# Patient Record
Sex: Female | Born: 1970 | Race: White | Hispanic: No | Marital: Married | Smoking: Never smoker
Health system: Southern US, Community
[De-identification: ages and names within clinical notes are randomized; demographics above are authoritative.]

## PROBLEM LIST (undated history)

## (undated) DIAGNOSIS — N809 Endometriosis, unspecified: Secondary | ICD-10-CM

## (undated) DIAGNOSIS — N200 Calculus of kidney: Secondary | ICD-10-CM

## (undated) DIAGNOSIS — T7840XA Allergy, unspecified, initial encounter: Secondary | ICD-10-CM

## (undated) HISTORY — PX: OOPHORECTOMY: SHX86

---

## 2000-07-27 ENCOUNTER — Encounter: Admission: RE | Admit: 2000-07-27 | Discharge: 2000-07-27 | Payer: Self-pay | Admitting: Obstetrics and Gynecology

## 2000-07-27 ENCOUNTER — Encounter: Payer: Self-pay | Admitting: Obstetrics and Gynecology

## 2002-05-27 ENCOUNTER — Other Ambulatory Visit: Admission: RE | Admit: 2002-05-27 | Discharge: 2002-05-27 | Payer: Self-pay | Admitting: Obstetrics and Gynecology

## 2007-07-10 ENCOUNTER — Encounter (INDEPENDENT_AMBULATORY_CARE_PROVIDER_SITE_OTHER): Payer: Self-pay | Admitting: Obstetrics and Gynecology

## 2007-07-10 ENCOUNTER — Ambulatory Visit (HOSPITAL_COMMUNITY): Admission: RE | Admit: 2007-07-10 | Discharge: 2007-07-10 | Payer: Self-pay | Admitting: Obstetrics and Gynecology

## 2010-12-27 NOTE — Op Note (Signed)
Alisha Lam, Alisha Lam                 ACCOUNT NO.:  [id number]   MEDICAL RECORD NO.:  [id number]          PATIENT TYPE:  AMB   LOCATION:  SDC                           FACILITY:  [facility]   PHYSICIAN:  Guy Sandifer. Henderson Cloud, M.D. DATE OF BIRTH:  May 01, 1971   DATE OF PROCEDURE:  07/10/2007  DATE OF DISCHARGE:                               OPERATIVE REPORT   PREOPERATIVE DIAGNOSIS:  Bilateral ovarian cysts.   POSTOPERATIVE DIAGNOSIS:  Severe endometriosis.   PROCEDURE:  Laparoscopy with bilateral ovarian cystectomy, lysis of  adhesions and ablation of endometriosis.   SURGEON:  Harold Hedge, MD   ANESTHESIA:  General with endotracheal intubation.   SPECIMENS:  Bilateral ovarian cyst walls and bilateral ovarian cyst  fluid, all the pathology.   INDICATIONS AND CONSENT:  The patient is a 40 year old married white  female G1, P0, husband status post vasectomy who is essentially without  complaint.  She has bilateral ovarian cysts.  Details have been dictated  in the history and physical.  Possible bilateral ovarian cystectomy,  possible unilateral salpingo-oophorectomy has been discussed.  The  patient understands the risk of recurrent endometriomas.  However, she  desires to retain one or both ovaries if at all possible.  Potential  risks and complications have been discussed including but not limited to  infection, organ damage, bleeding requiring transfusion of blood  products with possible HIV and hepatitis acquisition, DVT, PE,  pneumonia, postoperative dyspareunia, pelvic pain.  All questions were  answered and consent signed on the chart.   FINDINGS:  Upper abdomen is grossly normal.  Appendix is normal.  Uterus  is normal in size and contour.  Anterior cul-de-sac is normal.  Posterior cul-de-sac contains multiple filmy adhesions that are  beginning to tent up the rectum to the lower uterine segment.  There are  dark black implants of endometriosis on the right pelvic sidewall.  The  right ovary contains a 4 to 5-cm smooth translucent cyst and this is  adherent to the right pelvic sidewall.  The right fallopian tube is  normal.  Left ovary contains a similarly sized cyst and was not  adherent.  The left fallopian tube is normal.   PROCEDURE:  The patient taken to operating room where she is identified,  placed in dorsosupine position and general anesthesia is induced via  endotracheal intubation.  She is then placed in dorsal lithotomy  position where she is prepped abdominally and vaginally, bladder is  straight catheterized and she is draped.  A Hulka tenaculum was placed  in the uterus as a manipulator.  She is draped in sterile fashion.  The  infraumbilical and suprapubic areas injected the midline with 0.5% plain  Marcaine.  A small infraumbilical incision is made and disposable Veress  needle was placed.  Normal syringe and drop test are noted.  2 liters of  gas are insufflated under low pressure with good tympany in the right  upper quadrant.  Veress needle is removed and a 10/11 XL bladeless  disposable trocar sleeve was placed using direct visualization with  diagnostic laparoscope.  After placement,  the diagnostic scope was  removed and the operative laparoscope was placed.  The above findings  were noted.  Small suprapubic incision is made and a 5-mm bladeless XL  disposable trocar sleeve was placed under direct visualization without  difficulty.  The above findings were noted.  The right ovarian cyst was  then aspirated for a thick chocolate fluid.  The cyst wall is then  sharply resected.  The remaining base of the cyst is cauterized with  bipolar cautery.  This also releases the ovary from the right pelvic  sidewall.  Good hemostasis is noted.  Copious irrigation is carried out  throughout the case.  Similar procedure is carried out on the left  ovary.  The adhesions in the posterior cul-de-sac to taken down sharply  without difficulty.  After inspection  of the location of the right  ureter, the implants of pelvic sidewall are cauterized with bipolar  cautery well clear of the course of the ureter.  After assuring  hemostasis and recovering all the excess fluid, Interceed is back loaded  through the laparoscopic and placed around each ovary.  Suprapubic  trocar sleeve is removed.  Pneumoperitoneum is reduced and the umbilical  trocar sleeve was removed.  The umbilical incision is closed on the skin  with interrupted 3-0 Vicryl sutures which is also used on the suprapubic  incision as well.  Dermabond was placed on both incisions.  Dressings  were applied.  Hulka tenaculum is removed and good hemostasis is noted.  All counts correct.  The patient is awakened and taken to recovery room  in condition.      Guy Sandifer Henderson Cloud, M.D.  Electronically Signed     JET/MEDQ  D:  07/10/2007  T:  07/10/2007  Job:  [id number]

## 2010-12-27 NOTE — H&P (Signed)
Alisha Lam, Alisha Lam                 ACCOUNT NO.:  [id number]   MEDICAL RECORD NO.:  [id number]          PATIENT TYPE:  AMB   LOCATION:                                FACILITY:  [facility]   PHYSICIAN:  Guy Sandifer. Henderson Cloud, M.D. DATE OF BIRTH:  07/21/1971   DATE OF ADMISSION:  07/10/2007  DATE OF DISCHARGE:                              HISTORY & PHYSICAL   CHIEF COMPLAINT:  Bilateral ovarian cysts.   HISTORY OF PRESENT ILLNESS:  The patient is 40 year old married white  female G1 P0, husband status post vasectomy who was without complaint.  At annual examination in October of this year, she had bilateral adnexal  masses.  Subsequent ultrasound on May 31, 2007 revealed a 4.4 cm  cyst on the right and 4.9 cm cyst on left ovaries.  Both were suspicious  for endometriomas.  A repeat ultrasound on July 01, 2007 revealed  these to be essentially unchanged.  Laparoscopy with bilateral ovarian  cystectomy has been discussed.  The patient understands the risk for  recurrence of endometrioma in the setting of severe endometriosis.  However, in view of her age and the bilaterality at the of the cysts,  she would prefer to keep one or both ovaries if possible.  Potential  risks and complications have also been reviewed.   PAST MEDICAL HISTORY:  Negative.   PAST SURGICAL HISTORY:  Bilateral breast augmentation.   OBSTETRICAL HISTORY:  TAB times one.   FAMILY HISTORY:  Chronic hypertension in maternal grandmother.   SOCIAL HISTORY:  Denies tobacco, alcohol or drug abuse.   REVIEW OF SYSTEMS:  NEURO:  Denies headache.  CARDIO:  No chest pain.  PULMONARY: Denies shortness of breath.  GI: Denies recent changes in  bowel habits.   MEDICATIONS:  None.   ALLERGIES:  NO KNOWN DRUG ALLERGIES.   PHYSICAL EXAMINATION:  VITAL SIGNS:  Height 5 feet 2 inches, weight 103  pounds, blood pressure 102/68. HEENT: Without thyromegaly.  LUNGS: Clear to auscultation.  HEART:  Regular rate and rhythm.  BACK:   No CVA tenderness.  BREASTS:  Status post augmentation bilaterally without mass, retraction,  discharge.  ABDOMEN: Soft, nontender without masses.  PELVIC:  Both vagina and cervix without lesion.  Uterus is retroverted,  normal size, mobile, nontender.  There are bilateral adnexal masses 3-5  cm in size.  EXTREMITIES: Grossly within normal limits.  NEUROLOGICAL:  Exam grossly within normal limits.   ASSESSMENT:  Bilateral ovarian cysts.   PLAN:  Laparoscopy, probable bilateral ovarian cystectomy, possible  salpingo-oophorectomy.      Guy Sandifer Henderson Cloud, M.D.  Electronically Signed     JET/MEDQ  D:  07/01/2007  T:  07/02/2007  Job:  [id number]

## 2011-05-23 LAB — CBC
HCT: 40.5
Hemoglobin: 14
MCHC: 34.5
MCV: 87.7
Platelets: 204
RBC: 4.62
RDW: 12.5
WBC: 6.4

## 2011-10-25 ENCOUNTER — Other Ambulatory Visit: Payer: Self-pay | Admitting: Obstetrics and Gynecology

## 2012-02-26 ENCOUNTER — Other Ambulatory Visit: Payer: Self-pay | Admitting: Obstetrics and Gynecology

## 2013-07-08 ENCOUNTER — Emergency Department (HOSPITAL_COMMUNITY)
Admission: EM | Admit: 2013-07-08 | Discharge: 2013-07-08 | Disposition: A | Discharge status: Home / Self Care | Payer: BC Managed Care – PPO | Attending: Emergency Medicine | Admitting: Emergency Medicine

## 2013-07-08 ENCOUNTER — Emergency Department (HOSPITAL_COMMUNITY): Payer: BC Managed Care – PPO

## 2013-07-08 ENCOUNTER — Encounter (HOSPITAL_COMMUNITY): Payer: Self-pay | Admitting: Emergency Medicine

## 2013-07-08 DIAGNOSIS — N23 Unspecified renal colic: Secondary | ICD-10-CM

## 2013-07-08 DIAGNOSIS — Z3202 Encounter for pregnancy test, result negative: Secondary | ICD-10-CM | POA: Insufficient documentation

## 2013-07-08 DIAGNOSIS — Z8742 Personal history of other diseases of the female genital tract: Secondary | ICD-10-CM | POA: Insufficient documentation

## 2013-07-08 DIAGNOSIS — N201 Calculus of ureter: Secondary | ICD-10-CM | POA: Insufficient documentation

## 2013-07-08 DIAGNOSIS — Z79899 Other long term (current) drug therapy: Secondary | ICD-10-CM | POA: Insufficient documentation

## 2013-07-08 HISTORY — DX: Endometriosis, unspecified: N80.9

## 2013-07-08 LAB — CBC WITH DIFFERENTIAL/PLATELET
Basophils Absolute: 0 10*3/uL (ref 0.0–0.1)
Basophils Relative: 0 % (ref 0–1)
Eosinophils Absolute: 0 10*3/uL (ref 0.0–0.7)
Eosinophils Relative: 0 % (ref 0–5)
HCT: 40.1 % (ref 36.0–46.0)
Hemoglobin: 13.9 g/dL (ref 12.0–15.0)
Lymphocytes Relative: 11 % — ABNORMAL LOW (ref 12–46)
Lymphs Abs: 1 10*3/uL (ref 0.7–4.0)
MCH: 30 pg (ref 26.0–34.0)
MCHC: 34.7 g/dL (ref 30.0–36.0)
MCV: 86.4 fL (ref 78.0–100.0)
Monocytes Absolute: 0.4 10*3/uL (ref 0.1–1.0)
Monocytes Relative: 4 % (ref 3–12)
Neutro Abs: 7.8 10*3/uL — ABNORMAL HIGH (ref 1.7–7.7)
Neutrophils Relative %: 84 % — ABNORMAL HIGH (ref 43–77)
Platelets: 236 10*3/uL (ref 150–400)
RBC: 4.64 MIL/uL (ref 3.87–5.11)
RDW: 12.3 % (ref 11.5–15.5)
WBC: 9.3 10*3/uL (ref 4.0–10.5)

## 2013-07-08 LAB — URINE MICROSCOPIC-ADD ON

## 2013-07-08 LAB — COMPREHENSIVE METABOLIC PANEL
ALT: 15 U/L (ref 0–35)
AST: 19 U/L (ref 0–37)
Albumin: 4 g/dL (ref 3.5–5.2)
Alkaline Phosphatase: 76 U/L (ref 39–117)
BUN: 12 mg/dL (ref 6–23)
CO2: 25 mEq/L (ref 19–32)
Calcium: 9.3 mg/dL (ref 8.4–10.5)
Chloride: 103 mEq/L (ref 96–112)
Creatinine, Ser: 1.05 mg/dL (ref 0.50–1.10)
GFR calc Af Amer: 74 mL/min — ABNORMAL LOW (ref >90)
GFR calc non Af Amer: 64 mL/min — ABNORMAL LOW (ref >90)
Glucose, Bld: 121 mg/dL — ABNORMAL HIGH (ref 70–99)
Potassium: 3.9 mEq/L (ref 3.5–5.1)
Sodium: 139 mEq/L (ref 135–145)
Total Bilirubin: 0.5 mg/dL (ref 0.3–1.2)
Total Protein: 7 g/dL (ref 6.0–8.3)

## 2013-07-08 LAB — URINALYSIS, ROUTINE W REFLEX MICROSCOPIC
Bilirubin Urine: NEGATIVE
Glucose, UA: NEGATIVE mg/dL
Ketones, ur: NEGATIVE mg/dL
Nitrite: NEGATIVE
Protein, ur: NEGATIVE mg/dL
Specific Gravity, Urine: 1.015 (ref 1.005–1.030)
Urobilinogen, UA: 0.2 mg/dL (ref 0.0–1.0)
pH: 7.5 (ref 5.0–8.0)

## 2013-07-08 LAB — PREGNANCY, URINE: Preg Test, Ur: NEGATIVE

## 2013-07-08 LAB — LIPASE, BLOOD: Lipase: 25 U/L (ref 11–59)

## 2013-07-08 MED ORDER — ONDANSETRON 8 MG PO TBDP
ORAL_TABLET | ORAL | Status: AC
  Filled 2013-07-08: qty 1

## 2013-07-08 MED ORDER — TAMSULOSIN HCL 0.4 MG PO CAPS: 0.4000 mg | ORAL_CAPSULE | Freq: Every day | ORAL | Status: DC

## 2013-07-08 MED ORDER — NAPROXEN 250 MG PO TABS: 250.0000 mg | ORAL_TABLET | Freq: Two times a day (BID) | ORAL | Status: DC

## 2013-07-08 MED ORDER — ONDANSETRON HCL 4 MG PO TABS: 4.0000 mg | ORAL_TABLET | Freq: Three times a day (TID) | ORAL | Status: DC | PRN

## 2013-07-08 MED ORDER — OXYCODONE-ACETAMINOPHEN 5-325 MG PO TABS
2.0000 | ORAL_TABLET | Freq: Once | ORAL | Status: AC
  Administered 2013-07-08: 2 via ORAL
  Filled 2013-07-08: qty 2

## 2013-07-08 MED ORDER — ONDANSETRON 8 MG PO TBDP
8.0000 mg | ORAL_TABLET | Freq: Once | ORAL | Status: AC
  Administered 2013-07-08: 8 mg via ORAL

## 2013-07-08 MED ORDER — OXYCODONE-ACETAMINOPHEN 5-325 MG PO TABS: ORAL_TABLET | ORAL | Status: DC

## 2013-07-08 NOTE — ED Notes (Signed)
Sudden onset escalating pain.

## 2013-07-08 NOTE — ED Notes (Signed)
MD made aware of CT results being up.  Patient asking about further pain management.

## 2013-07-08 NOTE — ED Notes (Signed)
Left ukpper flank/ruq pain since Saturday. Diarrhea Sunday only. N/v x 2 today. Mm wet. Pt moaning/grimacing. Urinary freq with little amount. Slightly restless.

## 2013-07-08 NOTE — ED Provider Notes (Signed)
CSN: [id number]     Arrival date & time 07/08/13  1047 History   First MD Initiated Contact with Patient 07/08/13 1102     Chief Complaint  Patient presents with  . Abdominal Pain    HPI Pt was seen at 1035. Per pt, c/o sudden onset and persistence of waxing and waning left sided flank "pain" for the past 1 week, worse this morning.  Pt describes the pain as "dull," "throbbing," and occasionally "sharp," and radiating into the left side of her abd.  Has been associated with multiple intermittent episodes of N/V. Pt was evaluated by her PMD this morning PTA, tx with toradol and zofran, then sent to the ED for further eval.  Denies vaginal bleeding/discharge, no dysuria/hematuria, no abd pain, no diarrhea, no black or blood in emesis, no CP/SOB, no fevers.     Past Medical History  Diagnosis Date  . Endometriosis    Past Surgical History  Procedure Laterality Date  . Oophorectomy     Family History  Problem Relation Age of Onset  . Kidney Stones Father    History  Substance Use Topics  . Smoking status: Never Smoker   . Smokeless tobacco: Not on file  . Alcohol Use: No    Review of Systems ROS: Statement: All systems negative except as marked or noted in the HPI; Constitutional: Negative for fever and chills. ; ; Eyes: Negative for eye pain, redness and discharge. ; ; ENMT: Negative for ear pain, hoarseness, nasal congestion, sinus pressure and sore throat. ; ; Cardiovascular: Negative for chest pain, palpitations, diaphoresis, dyspnea and peripheral edema. ; ; Respiratory: Negative for cough, wheezing and stridor. ; ; Gastrointestinal: +N/V. Negative for diarrhea, abdominal pain, blood in stool, hematemesis, jaundice and rectal bleeding. . ; ; Genitourinary: +flank pain. Negative for dysuria and hematuria. ; ; Musculoskeletal: Negative for back pain and neck pain. Negative for swelling and trauma.; ; Skin: Negative for pruritus, rash, abrasions, blisters, bruising and skin lesion.; ;  Neuro: Negative for headache, lightheadedness and neck stiffness. Negative for weakness, altered level of consciousness , altered mental status, extremity weakness, paresthesias, involuntary movement, seizure and syncope.     Allergies  Zithromax  Home Medications   Current Outpatient Rx  Name  Route  Sig  Dispense  Refill  . loratadine (CLARITIN) 10 MG tablet   Oral   Take 10 mg by mouth daily.         . naproxen (NAPROSYN) 250 MG tablet   Oral   Take 1 tablet (250 mg total) by mouth 2 (two) times daily with a meal.   14 tablet   0   . ondansetron (ZOFRAN) 4 MG tablet   Oral   Take 1 tablet (4 mg total) by mouth every 8 (eight) hours as needed for nausea or vomiting.   6 tablet   0   . oxyCODONE-acetaminophen (PERCOCET/ROXICET) 5-325 MG per tablet      1 or 2 tabs PO q6h prn pain   25 tablet   0   . tamsulosin (FLOMAX) 0.4 MG CAPS capsule   Oral   Take 1 capsule (0.4 mg total) by mouth at bedtime.   5 capsule   0    BP 91/64  Pulse 79  Resp 16  SpO2 98%  LMP 06/17/2013 Physical Exam 1040: Physical examination:  Nursing notes reviewed; Vital signs and O2 SAT reviewed;  Constitutional: Well developed, Well nourished, Well hydrated, Uncomfortable appearing; Head:  Normocephalic, atraumatic; Eyes: EOMI, PERRL,  No scleral icterus; ENMT: Mouth and pharynx normal, Mucous membranes moist; Neck: Supple, Full range of motion, No lymphadenopathy; Cardiovascular: Regular rate and rhythm, No gallop; Respiratory: Breath sounds clear & equal bilaterally, No rales, rhonchi, wheezes.  Speaking full sentences with ease, Normal respiratory effort/excursion; Chest: Nontender, Movement normal; Abdomen: Soft, Nontender, Nondistended, Normal bowel sounds; Genitourinary: No CVA tenderness; Spine:  No midline CS, TS, LS tenderness.;; Extremities: Pulses normal, No tenderness, No edema, No calf edema or asymmetry.; Neuro: AA&Ox3, Major CN grossly intact.  Speech clear. No gross focal motor or  sensory deficits in extremities.; Skin: Color normal, Warm, Dry.   ED Course  Procedures   EKG Interpretation   None       MDM  MDM Reviewed: previous chart, nursing note and vitals Reviewed previous: labs Interpretation: labs and CT scan     Results for orders placed during the hospital encounter of 07/08/13  URINALYSIS, ROUTINE W REFLEX MICROSCOPIC      Result Value Range   Color, Urine YELLOW  YELLOW   APPearance CLEAR  CLEAR   Specific Gravity, Urine 1.015  1.005 - 1.030   pH 7.5  5.0 - 8.0   Glucose, UA NEGATIVE  NEGATIVE mg/dL   Hgb urine dipstick LARGE (*) NEGATIVE   Bilirubin Urine NEGATIVE  NEGATIVE   Ketones, ur NEGATIVE  NEGATIVE mg/dL   Protein, ur NEGATIVE  NEGATIVE mg/dL   Urobilinogen, UA 0.2  0.0 - 1.0 mg/dL   Nitrite NEGATIVE  NEGATIVE   Leukocytes, UA TRACE (*) NEGATIVE  PREGNANCY, URINE      Result Value Range   Preg Test, Ur NEGATIVE  NEGATIVE  CBC WITH DIFFERENTIAL      Result Value Range   WBC 9.3  4.0 - 10.5 K/uL   RBC 4.64  3.87 - 5.11 MIL/uL   Hemoglobin 13.9  12.0 - 15.0 g/dL   HCT 56.2  13.0 - 86.5 %   MCV 86.4  78.0 - 100.0 fL   MCH 30.0  26.0 - 34.0 pg   MCHC 34.7  30.0 - 36.0 g/dL   RDW 78.4  69.6 - 29.5 %   Platelets 236  150 - 400 K/uL   Neutrophils Relative % 84 (*) 43 - 77 %   Neutro Abs 7.8 (*) 1.7 - 7.7 K/uL   Lymphocytes Relative 11 (*) 12 - 46 %   Lymphs Abs 1.0  0.7 - 4.0 K/uL   Monocytes Relative 4  3 - 12 %   Monocytes Absolute 0.4  0.1 - 1.0 K/uL   Eosinophils Relative 0  0 - 5 %   Eosinophils Absolute 0.0  0.0 - 0.7 K/uL   Basophils Relative 0  0 - 1 %   Basophils Absolute 0.0  0.0 - 0.1 K/uL  LIPASE, BLOOD      Result Value Range   Lipase 25  11 - 59 U/L  COMPREHENSIVE METABOLIC PANEL      Result Value Range   Sodium 139  135 - 145 mEq/L   Potassium 3.9  3.5 - 5.1 mEq/L   Chloride 103  96 - 112 mEq/L   CO2 25  19 - 32 mEq/L   Glucose, Bld 121 (*) 70 - 99 mg/dL   BUN 12  6 - 23 mg/dL   Creatinine, Ser  2.84  0.50 - 1.10 mg/dL   Calcium 9.3  8.4 - 13.2 mg/dL   Total Protein 7.0  6.0 - 8.3 g/dL   Albumin 4.0  3.5 -  5.2 g/dL   AST 19  0 - 37 U/L   ALT 15  0 - 35 U/L   Alkaline Phosphatase 76  39 - 117 U/L   Total Bilirubin 0.5  0.3 - 1.2 mg/dL   GFR calc non Af Amer 64 (*) >90 mL/min   GFR calc Af Amer 74 (*) >90 mL/min  URINE MICROSCOPIC-ADD ON      Result Value Range   Squamous Epithelial / LPF MANY (*) RARE   WBC, UA 0-2  <3 WBC/hpf   RBC / HPF TOO NUMEROUS TO COUNT  <3 RBC/hpf   Bacteria, UA MANY (*) RARE   Ct Abdomen Pelvis Wo Contrast 07/08/2013   CLINICAL DATA:  Left lower quadrant discomfort, recent history of diarrhea  EXAM: CT ABDOMEN AND PELVIS WITHOUT CONTRAST  TECHNIQUE: Multidetector CT imaging of the abdomen and pelvis was performed following the standard protocol without intravenous contrast.  COMPARISON:  None.  FINDINGS: There is mild hydronephrosis and hydroureter on the left secondary to 4 mm diameter stone at the left ureterovesical junction. This is demonstrated on image 66 of series 2. The urinary bladder is decompressed. There are other stones within the calices on the left with the largest measuring 3.8 mm in diameter. There is minimally increased density in the perinephric fat on the left. On the right there are nonobstructing stones present. The largest lies in a midpole calyx and measures 7 x 5.5 mm.  The liver, gallbladder, spleen, pancreas, and adrenal glands exhibit no acute abnormality. The caliber of the abdominal aorta is normal. The periaortic and pericaval regions exhibit no lymphadenopathy. The un-opacified loops of small and large bowel are normal in appearance. A normal calibered, partially gas-filled, uninflamed appearing appendix is demonstrated.  Within the pelvis the uterus is retroverted. There are no adnexal masses nor is there free pelvic fluid. There is no inguinal nor significant umbilical hernia. The lumbar vertebral bodies are preserved in height.  The psoas musculature is normal in contour. The lung bases exhibit no acute abnormalities.  IMPRESSION: 1. There is mild hydronephrosis and hydroureter on the left secondary to a stone measuring of 4 mm in diameter at the left ureterovesical junction. There are multiple other nonobstructing stones on the left as well as numerous nonobstructing stones on the right. 2. There is no acute hepatobiliary abnormality nor acute bowel abnormality. 3. The uterus is retroverted. No adnexal masses or inflammatory processes are demonstrated.   Electronically Signed   By: David  [place]   On: 07/08/2013 11:51    1340:  Pt feels better after meds and wants to go home now. Dx and testing d/w pt and family.  Questions answered.  Verb understanding, agreeable to d/c home with outpt f/u.    SHAKERRA RED, DO 07/09/13 2159

## 2014-02-16 ENCOUNTER — Other Ambulatory Visit: Payer: Self-pay | Admitting: Obstetrics and Gynecology

## 2015-03-04 ENCOUNTER — Other Ambulatory Visit: Payer: Self-pay | Admitting: Obstetrics and Gynecology

## 2015-03-05 ENCOUNTER — Other Ambulatory Visit: Payer: Self-pay | Admitting: Obstetrics and Gynecology

## 2015-03-05 ENCOUNTER — Ambulatory Visit
Admission: RE | Admit: 2015-03-05 | Discharge: 2015-03-05 | Disposition: A | Discharge status: Home / Self Care | Payer: BC Managed Care – PPO | Source: Ambulatory Visit | Attending: Obstetrics and Gynecology | Admitting: Obstetrics and Gynecology

## 2015-03-05 DIAGNOSIS — M79604 Pain in right leg: Secondary | ICD-10-CM

## 2015-03-08 LAB — CYTOLOGY - PAP

## 2019-10-19 ENCOUNTER — Ambulatory Visit: Payer: BC Managed Care – PPO | Attending: Internal Medicine

## 2019-10-19 DIAGNOSIS — Z23 Encounter for immunization: Secondary | ICD-10-CM

## 2019-10-19 NOTE — Progress Notes (Signed)
   Covid-19 Vaccination Clinic  Name:  Jabria Loos    MRN: [id number] DOB: [birth year]  10/19/2019  Ms. Burack was observed post Covid-19 immunization for 15 minutes without incident. She was provided with Vaccine Information Sheet and instruction to access the V-Safe system.   Ms. Ramseyer was instructed to call 911 with any severe reactions post vaccine: Marland Kitchen Difficulty breathing  . Swelling of face and throat  . A fast heartbeat  . A bad rash all over body  . Dizziness and weakness   Immunizations Administered    Name Date Dose VIS Date Route   Pfizer COVID-19 Vaccine 10/19/2019  5:45 PM 0.3 mL 07/25/2019 Intramuscular   Manufacturer: [employer], [employer]   Lot: [id number]   NDC: [address]-5

## 2019-11-09 ENCOUNTER — Ambulatory Visit: Payer: BC Managed Care – PPO | Attending: Internal Medicine

## 2019-11-09 DIAGNOSIS — Z23 Encounter for immunization: Secondary | ICD-10-CM

## 2019-11-09 NOTE — Progress Notes (Signed)
   Covid-19 Vaccination Clinic  Name:  Sareena Odeh    MRN: [id number] DOB: [birth year]  11/09/2019  Ms. Brutus was observed post Covid-19 immunization for 15 minutes without incident. She was provided with Vaccine Information Sheet and instruction to access the V-Safe system.   Ms. Dudgeon was instructed to call 911 with any severe reactions post vaccine: Marland Kitchen Difficulty breathing  . Swelling of face and throat  . A fast heartbeat  . A bad rash all over body  . Dizziness and weakness   Immunizations Administered    Name Date Dose VIS Date Route   Pfizer COVID-19 Vaccine 11/09/2019  3:05 PM 0.3 mL 07/25/2019 Intramuscular   Manufacturer: [employer], [employer]   Lot: [id number]   NDC: [address]-3

## 2020-08-01 ENCOUNTER — Emergency Department (HOSPITAL_COMMUNITY): Payer: BC Managed Care – PPO

## 2020-08-01 ENCOUNTER — Emergency Department (HOSPITAL_COMMUNITY)
Admission: EM | Admit: 2020-08-01 | Discharge: 2020-08-01 | Disposition: A | Discharge status: Home / Self Care | Payer: BC Managed Care – PPO | Attending: Emergency Medicine | Admitting: Emergency Medicine

## 2020-08-01 ENCOUNTER — Other Ambulatory Visit: Payer: Self-pay

## 2020-08-01 ENCOUNTER — Encounter (HOSPITAL_COMMUNITY): Payer: Self-pay | Admitting: Emergency Medicine

## 2020-08-01 DIAGNOSIS — Z87442 Personal history of urinary calculi: Secondary | ICD-10-CM | POA: Diagnosis not present

## 2020-08-01 DIAGNOSIS — R109 Unspecified abdominal pain: Secondary | ICD-10-CM | POA: Diagnosis present

## 2020-08-01 DIAGNOSIS — N200 Calculus of kidney: Secondary | ICD-10-CM | POA: Diagnosis not present

## 2020-08-01 HISTORY — DX: Allergy, unspecified, initial encounter: T78.40XA

## 2020-08-01 HISTORY — DX: Calculus of kidney: N20.0

## 2020-08-01 LAB — CBC
HCT: 42 % (ref 36.0–46.0)
Hemoglobin: 14.2 g/dL (ref 12.0–15.0)
MCH: 29.6 pg (ref 26.0–34.0)
MCHC: 33.8 g/dL (ref 30.0–36.0)
MCV: 87.7 fL (ref 80.0–100.0)
Platelets: 247 10*3/uL (ref 150–400)
RBC: 4.79 MIL/uL (ref 3.87–5.11)
RDW: 11.7 % (ref 11.5–15.5)
WBC: 9.4 10*3/uL (ref 4.0–10.5)
nRBC: 0 % (ref 0.0–0.2)

## 2020-08-01 LAB — COMPREHENSIVE METABOLIC PANEL
ALT: 16 U/L (ref 0–44)
AST: 19 U/L (ref 15–41)
Albumin: 3.7 g/dL (ref 3.5–5.0)
Alkaline Phosphatase: 65 U/L (ref 38–126)
Anion gap: 12 (ref 5–15)
BUN: 17 mg/dL (ref 6–20)
CO2: 25 mmol/L (ref 22–32)
Calcium: 9.3 mg/dL (ref 8.9–10.3)
Chloride: 103 mmol/L (ref 98–111)
Creatinine, Ser: 1.1 mg/dL — ABNORMAL HIGH (ref 0.44–1.00)
GFR, Estimated: >60 mL/min (ref >60)
Glucose, Bld: 152 mg/dL — ABNORMAL HIGH (ref 70–99)
Potassium: 4.2 mmol/L (ref 3.5–5.1)
Sodium: 140 mmol/L (ref 135–145)
Total Bilirubin: 1 mg/dL (ref 0.3–1.2)
Total Protein: 6.5 g/dL (ref 6.5–8.1)

## 2020-08-01 LAB — URINALYSIS, ROUTINE W REFLEX MICROSCOPIC
Bilirubin Urine: NEGATIVE
Glucose, UA: NEGATIVE mg/dL
Ketones, ur: NEGATIVE mg/dL
Leukocytes,Ua: NEGATIVE
Nitrite: NEGATIVE
Protein, ur: NEGATIVE mg/dL
Specific Gravity, Urine: 1.017 (ref 1.005–1.030)
pH: 8 (ref 5.0–8.0)

## 2020-08-01 LAB — LIPASE, BLOOD: Lipase: 28 U/L (ref 11–51)

## 2020-08-01 MED ORDER — OXYCODONE-ACETAMINOPHEN 5-325 MG PO TABS: 1.0000 | ORAL_TABLET | Freq: Three times a day (TID) | ORAL | 0 refills | Status: AC | PRN

## 2020-08-01 MED ORDER — HYDROMORPHONE HCL 1 MG/ML IJ SOLN
1.0000 mg | Freq: Once | INTRAMUSCULAR | Status: AC
  Administered 2020-08-01: 1 mg via INTRAVENOUS
  Filled 2020-08-01: qty 1

## 2020-08-01 MED ORDER — PROMETHAZINE HCL 25 MG/ML IJ SOLN
12.5000 mg | Freq: Once | INTRAMUSCULAR | Status: AC
  Administered 2020-08-01: 12.5 mg via INTRAVENOUS
  Filled 2020-08-01: qty 1

## 2020-08-01 MED ORDER — KETOROLAC TROMETHAMINE 30 MG/ML IJ SOLN
30.0000 mg | Freq: Once | INTRAMUSCULAR | Status: AC
  Administered 2020-08-01: 30 mg via INTRAVENOUS
  Filled 2020-08-01: qty 1

## 2020-08-01 MED ORDER — ONDANSETRON 4 MG PO TBDP
4.0000 mg | ORAL_TABLET | Freq: Once | ORAL | Status: AC | PRN
  Administered 2020-08-01: 4 mg via ORAL
  Filled 2020-08-01: qty 1

## 2020-08-01 MED ORDER — ONDANSETRON 4 MG PO TBDP: 4.0000 mg | ORAL_TABLET | Freq: Three times a day (TID) | ORAL | 0 refills | Status: AC | PRN

## 2020-08-01 MED ORDER — TAMSULOSIN HCL 0.4 MG PO CAPS: 0.4000 mg | ORAL_CAPSULE | Freq: Every day | ORAL | 0 refills | Status: DC

## 2020-08-01 NOTE — ED Notes (Signed)
Patient given discharge instructions. Questions were answered. Patient verbalized understanding of discharge instructions and care at home.  

## 2020-08-01 NOTE — ED Notes (Signed)
Pt to CT

## 2020-08-01 NOTE — ED Triage Notes (Signed)
C/o severe L sided abd pain since this morning with nausea, vomiting, and diarrhea.  States it feels the same as her kidney stone 8 years ago.

## 2020-08-01 NOTE — ED Provider Notes (Signed)
[facility] EMERGENCY DEPARTMENT Provider Note   CSN: [id number] Arrival date & time: 08/01/20  3557     History Chief Complaint  Patient presents with  . Abdominal Pain    Alisha Lam is a 49 y.o. female.  HPI Patient presents moaning loudly.  Somewhat difficult getting a history due to the moaning.  However developed left flank/abdominal pain acutely last night.  States she had some nausea vomiting and diarrhea also.  Had a kidney stone 8 years ago and thinks this may feel like that although she is not sure.  Not having fevers.  No dysuria.  Pain is sharp.    Past Medical History:  Diagnosis Date  . Allergies   . Endometriosis   . Kidney stones     There are no problems to display for this patient.   Past Surgical History:  Procedure Laterality Date  . OOPHORECTOMY       OB History   No obstetric history on file.     Family History  Problem Relation Age of Onset  . Kidney Stones Father     Social History   Tobacco Use  . Smoking status: Never Smoker  . Smokeless tobacco: Never Used  Substance Use Topics  . Alcohol use: No  . Drug use: No    Home Medications Prior to Admission medications   Medication Sig Start Date End Date Taking? Authorizing Provider  cetirizine (ZYRTEC) 10 MG tablet Take 10 mg by mouth daily.   Yes [provider]  naproxen (NAPROSYN) 250 MG tablet Take 1 tablet (250 mg total) by mouth 2 (two) times daily with a meal. Patient not taking: Reported on 08/01/2020 07/08/13   Samuel Jester, DO  ondansetron (ZOFRAN-ODT) 4 MG disintegrating tablet Take 1 tablet (4 mg total) by mouth every 8 (eight) hours as needed for nausea or vomiting. 08/01/20   Benjiman Core, MD  oxyCODONE-acetaminophen (PERCOCET/ROXICET) 5-325 MG tablet Take 1-2 tablets by mouth every 8 (eight) hours as needed for severe pain. 08/01/20   Benjiman Core, MD  tamsulosin (FLOMAX) 0.4 MG CAPS capsule Take 1 capsule (0.4 mg total) by  mouth daily. 08/01/20   Benjiman Core, MD    Allergies    Zithromax [azithromycin]  Review of Systems   Review of Systems  Constitutional: Negative for appetite change and fever.  HENT: Negative for congestion.   Respiratory: Negative for shortness of breath.   Cardiovascular: Negative for chest pain.  Gastrointestinal: Positive for abdominal pain, diarrhea, nausea and vomiting.  Genitourinary: Positive for flank pain.  Musculoskeletal: Negative for back pain.  Skin: Negative for rash.  Neurological: Negative for weakness.  Psychiatric/Behavioral: Negative for confusion.    Physical Exam Updated Vital Signs BP 135/84 (BP Location: Left Arm)   Pulse 81   Temp (!) 97.5 F (36.4 C) (Oral)   Resp 15   LMP 06/17/2013   SpO2 94%   Physical Exam Vitals and nursing note reviewed.  Constitutional:      Comments: Patient is yelling and moaning loudly.  HENT:     Head: Normocephalic.  Pulmonary:     Breath sounds: No wheezing or rhonchi.  Abdominal:     Tenderness: There is no abdominal tenderness.     Hernia: No hernia is present.     Comments: CVA tenderness on right.  Skin:    General: Skin is warm.     Capillary Refill: Capillary refill takes less than 2 seconds.  Neurological:     Mental Status:  She is alert.     ED Results / Procedures / Treatments   Labs (all labs ordered are listed, but only abnormal results are displayed) Labs Reviewed  COMPREHENSIVE METABOLIC PANEL - Abnormal; Notable for the following components:      Result Value   Glucose, Bld 152 (*)    Creatinine, Ser 1.10 (*)    All other components within normal limits  URINALYSIS, ROUTINE W REFLEX MICROSCOPIC - Abnormal; Notable for the following components:   APPearance HAZY (*)    Hgb urine dipstick SMALL (*)    Bacteria, UA RARE (*)    All other components within normal limits  LIPASE, BLOOD  CBC    EKG None  Radiology CT Renal Stone Study  Result Date: 08/01/2020 CLINICAL DATA:   LEFT-sided abdominal pain. EXAM: CT ABDOMEN AND PELVIS WITHOUT CONTRAST TECHNIQUE: Multidetector CT imaging of the abdomen and pelvis was performed following the standard protocol without IV contrast. COMPARISON:  July 08, 2013 FINDINGS: Lower chest: Partial visualization of LEFT breast implant. No acute finding. Hepatobiliary: Unremarkable noncontrast appearance of the liver. Gallbladder is unremarkable. No extrahepatic biliary ductal dilation. Pancreas: No peripancreatic fat stranding. Spleen: Spleen is unremarkable. Adrenals/Urinary Tract: Adrenal glands are unremarkable. There is an obstructing 5 mm ureterolithiasis within the proximal LEFT ureter with mild upstream hydronephrosis and adjacent fat stranding. There are several additional nonobstructing urolithiasis within the LEFT interpolar and inferior kidney. There are multiple RIGHT-sided nonobstructing nephrolithiasis, low the largest of which measures 9 mm. Overall stone burden is similar to minimally increased in comparison to prior. Bladder is unremarkable. No RIGHT-sided hydronephrosis. Stomach/Bowel: Tiny hiatal hernia. Appendix appears normal. No evidence of bowel wall thickening, distention, or inflammatory changes. Vascular/Lymphatic: Mild atherosclerotic calcifications throughout the normal caliber aorta. No suspicious lymphadenopathy. Reproductive: Uterus is present.  No suspicious adnexal masses. Other: No free air or free fluid. Musculoskeletal: No acute or significant osseous findings. IMPRESSION: 1. Obstructing 5 mm ureterolithiasis within the proximal LEFT ureter with mild upstream hydronephrosis and adjacent fat stranding. 2. Multiple additional bilateral nonobstructing renal calculi. Overall stone burden is similar to minimally increased in comparison to 2014. Aortic Atherosclerosis (ICD10-I70.0). Electronically Signed   By: Meda Klinefelter MD   On: 08/01/2020 12:23    Procedures Procedures (including critical care  time)  Medications Ordered in ED Medications  ondansetron (ZOFRAN-ODT) disintegrating tablet 4 mg (4 mg Oral Given 08/01/20 0928)  HYDROmorphone (DILAUDID) injection 1 mg (1 mg Intravenous Given 08/01/20 1101)  ketorolac (TORADOL) 30 MG/ML injection 30 mg (30 mg Intravenous Given 08/01/20 1202)  promethazine (PHENERGAN) injection 12.5 mg (12.5 mg Intravenous Given 08/01/20 1204)    ED Course  I have reviewed the triage vital signs and the nursing notes.  Pertinent labs & imaging results that were available during my care of the patient were reviewed by me and considered in my medical decision making (see chart for details).    MDM Rules/Calculators/A&P                          Patient with acute onset of flank pain.  Previous history of kidney stones.  Pain severe.  Required repeat doses of medication to get under control.  Urinalysis did not show infection.  However did show on the CT a large proximal stone.  Pain improved particularly after Toradol.  Will discharge with pain medicine antiemetics and will go some Flomax since it is large proximal stone.  Follow-up with urology.  Initial differential  diagnosis included but was not limited to ovarian cyst, kidney stone, perforated viscus Final Clinical Impression(s) / ED Diagnoses Final diagnoses:  Kidney stone    Rx / DC Orders ED Discharge Orders         Ordered    oxyCODONE-acetaminophen (PERCOCET/ROXICET) 5-325 MG tablet  Every 8 hours PRN        08/01/20 1313    ondansetron (ZOFRAN-ODT) 4 MG disintegrating tablet  Every 8 hours PRN        08/01/20 1313    tamsulosin (FLOMAX) 0.4 MG CAPS capsule  Daily        08/01/20 1313           Benjiman Core, MD 08/01/20 1543

## 2020-08-11 ENCOUNTER — Encounter (HOSPITAL_COMMUNITY)
Admission: AD | Disposition: A | Discharge status: Home / Self Care | Payer: Self-pay | Source: Other Acute Inpatient Hospital | Attending: Urology

## 2020-08-11 ENCOUNTER — Ambulatory Visit (HOSPITAL_COMMUNITY): Payer: BC Managed Care – PPO | Admitting: Anesthesiology

## 2020-08-11 ENCOUNTER — Ambulatory Visit (HOSPITAL_COMMUNITY): Payer: BC Managed Care – PPO

## 2020-08-11 ENCOUNTER — Encounter (HOSPITAL_COMMUNITY): Payer: Self-pay | Admitting: Urology

## 2020-08-11 ENCOUNTER — Ambulatory Visit (HOSPITAL_COMMUNITY)
Admission: AD | Admit: 2020-08-11 | Discharge: 2020-08-11 | Disposition: A | Discharge status: Home / Self Care | Payer: BC Managed Care – PPO | Source: Other Acute Inpatient Hospital | Attending: Urology | Admitting: Urology

## 2020-08-11 ENCOUNTER — Other Ambulatory Visit: Payer: Self-pay | Admitting: Urology

## 2020-08-11 DIAGNOSIS — Z20822 Contact with and (suspected) exposure to covid-19: Secondary | ICD-10-CM | POA: Insufficient documentation

## 2020-08-11 DIAGNOSIS — Z79899 Other long term (current) drug therapy: Secondary | ICD-10-CM | POA: Diagnosis not present

## 2020-08-11 DIAGNOSIS — Z87442 Personal history of urinary calculi: Secondary | ICD-10-CM | POA: Diagnosis not present

## 2020-08-11 DIAGNOSIS — N201 Calculus of ureter: Secondary | ICD-10-CM

## 2020-08-11 HISTORY — PX: CYSTOSCOPY WITH RETROGRADE PYELOGRAM, URETEROSCOPY AND STENT PLACEMENT: SHX5789

## 2020-08-11 LAB — RESP PANEL BY RT-PCR (FLU A&B, COVID) ARPGX2
Influenza A by PCR: NEGATIVE
Influenza B by PCR: NEGATIVE
SARS Coronavirus 2 by RT PCR: NEGATIVE

## 2020-08-11 SURGERY — CYSTOURETEROSCOPY, WITH RETROGRADE PYELOGRAM AND STENT INSERTION
Anesthesia: General | Laterality: Right

## 2020-08-11 MED ORDER — FENTANYL CITRATE (PF) 100 MCG/2ML IJ SOLN
INTRAMUSCULAR | Status: AC
  Filled 2020-08-11: qty 2

## 2020-08-11 MED ORDER — EPHEDRINE 5 MG/ML INJ
INTRAVENOUS | Status: AC
  Filled 2020-08-11: qty 10

## 2020-08-11 MED ORDER — IOHEXOL 300 MG/ML  SOLN
INTRAMUSCULAR | Status: DC | PRN
  Administered 2020-08-11: 5 mL

## 2020-08-11 MED ORDER — PROPOFOL 10 MG/ML IV BOLUS
INTRAVENOUS | Status: AC
  Filled 2020-08-11: qty 20

## 2020-08-11 MED ORDER — DEXAMETHASONE SODIUM PHOSPHATE 10 MG/ML IJ SOLN
INTRAMUSCULAR | Status: AC
  Filled 2020-08-11: qty 1

## 2020-08-11 MED ORDER — PROPOFOL 10 MG/ML IV BOLUS
INTRAVENOUS | Status: DC | PRN
  Administered 2020-08-11: 100 mg via INTRAVENOUS

## 2020-08-11 MED ORDER — OXYCODONE HCL 5 MG PO TABS: 5.0000 mg | ORAL_TABLET | Freq: Once | ORAL | Status: DC | PRN

## 2020-08-11 MED ORDER — MIDAZOLAM HCL 2 MG/2ML IJ SOLN
INTRAMUSCULAR | Status: AC
  Filled 2020-08-11: qty 2

## 2020-08-11 MED ORDER — CHLORHEXIDINE GLUCONATE 0.12 % MT SOLN
15.0000 mL | OROMUCOSAL | Status: AC
  Administered 2020-08-11: 15 mL via OROMUCOSAL

## 2020-08-11 MED ORDER — ONDANSETRON HCL 4 MG/2ML IJ SOLN
INTRAMUSCULAR | Status: DC | PRN
  Administered 2020-08-11: 4 mg via INTRAVENOUS

## 2020-08-11 MED ORDER — CEFAZOLIN SODIUM-DEXTROSE 2-4 GM/100ML-% IV SOLN
INTRAVENOUS | Status: AC
  Filled 2020-08-11: qty 100

## 2020-08-11 MED ORDER — LIDOCAINE 2% (20 MG/ML) 5 ML SYRINGE
INTRAMUSCULAR | Status: DC | PRN
  Administered 2020-08-11: 60 mg via INTRAVENOUS

## 2020-08-11 MED ORDER — CEFAZOLIN SODIUM-DEXTROSE 2-3 GM-%(50ML) IV SOLR
INTRAVENOUS | Status: DC | PRN
  Administered 2020-08-11: 2 g via INTRAVENOUS

## 2020-08-11 MED ORDER — LIDOCAINE HCL (PF) 2 % IJ SOLN
INTRAMUSCULAR | Status: AC
  Filled 2020-08-11: qty 5

## 2020-08-11 MED ORDER — DEXAMETHASONE SODIUM PHOSPHATE 10 MG/ML IJ SOLN
INTRAMUSCULAR | Status: DC | PRN
  Administered 2020-08-11: 10 mg via INTRAVENOUS

## 2020-08-11 MED ORDER — SODIUM CHLORIDE 0.9 % IR SOLN
Status: DC | PRN
  Administered 2020-08-11: 3000 mL

## 2020-08-11 MED ORDER — EPHEDRINE SULFATE-NACL 50-0.9 MG/10ML-% IV SOSY
PREFILLED_SYRINGE | INTRAVENOUS | Status: DC | PRN
  Administered 2020-08-11: 10 mg via INTRAVENOUS
  Administered 2020-08-11: 5 mg via INTRAVENOUS

## 2020-08-11 MED ORDER — OXYCODONE HCL 5 MG/5ML PO SOLN: 5.0000 mg | Freq: Once | ORAL | Status: DC | PRN

## 2020-08-11 MED ORDER — PROMETHAZINE HCL 25 MG/ML IJ SOLN: 6.2500 mg | INTRAMUSCULAR | Status: DC | PRN

## 2020-08-11 MED ORDER — LACTATED RINGERS IV SOLN: INTRAVENOUS | Status: DC | PRN

## 2020-08-11 MED ORDER — FENTANYL CITRATE (PF) 100 MCG/2ML IJ SOLN
INTRAMUSCULAR | Status: DC | PRN
  Administered 2020-08-11: 50 ug via INTRAVENOUS

## 2020-08-11 MED ORDER — CEPHALEXIN 500 MG PO CAPS: 500.0000 mg | ORAL_CAPSULE | Freq: Two times a day (BID) | ORAL | 0 refills | Status: DC

## 2020-08-11 MED ORDER — OXYBUTYNIN CHLORIDE 5 MG PO TABS: 5.0000 mg | ORAL_TABLET | Freq: Three times a day (TID) | ORAL | 1 refills | Status: AC | PRN

## 2020-08-11 MED ORDER — MIDAZOLAM HCL 5 MG/5ML IJ SOLN
INTRAMUSCULAR | Status: DC | PRN
  Administered 2020-08-11: 1 mg via INTRAVENOUS

## 2020-08-11 MED ORDER — FENTANYL CITRATE (PF) 100 MCG/2ML IJ SOLN
25.0000 ug | INTRAMUSCULAR | Status: DC | PRN
Start: 2020-08-11 — End: 2020-08-11

## 2020-08-11 MED ORDER — LACTATED RINGERS IV SOLN: Freq: Once | INTRAVENOUS | Status: AC

## 2020-08-11 SURGICAL SUPPLY — 22 items
BAG URO CATCHER STRL LF (MISCELLANEOUS) ×2 IMPLANT
CATH INTERMIT  6FR 70CM (CATHETERS) IMPLANT
CLOTH BEACON ORANGE TIMEOUT ST (SAFETY) ×2 IMPLANT
EXTRACTOR STONE NITINOL NGAGE (UROLOGICAL SUPPLIES) IMPLANT
GLOVE BIOGEL M 8.0 STRL (GLOVE) ×2 IMPLANT
GOWN STRL REUS W/TWL XL LVL3 (GOWN DISPOSABLE) ×2 IMPLANT
GUIDEWIRE ANG ZIPWIRE 038X150 (WIRE) IMPLANT
GUIDEWIRE STR DUAL SENSOR (WIRE) ×4 IMPLANT
IV NS 1000ML (IV SOLUTION) ×1
IV NS 1000ML BAXH (IV SOLUTION) ×1 IMPLANT
KIT TURNOVER KIT A (KITS) IMPLANT
LASER FIB FLEXIVA PULSE ID 365 (Laser) IMPLANT
MANIFOLD NEPTUNE II (INSTRUMENTS) ×2 IMPLANT
PACK CYSTO (CUSTOM PROCEDURE TRAY) ×2 IMPLANT
SHEATH URETERAL 12FRX28CM (UROLOGICAL SUPPLIES) IMPLANT
SHEATH URETERAL 12FRX35CM (MISCELLANEOUS) IMPLANT
SHEATH URETERAL 12FRX55CM (UROLOGICAL SUPPLIES) IMPLANT
STENT URET 6FRX24 CONTOUR (STENTS) ×2 IMPLANT
TRACTIP FLEXIVA PULS ID 200XHI (Laser) IMPLANT
TRACTIP FLEXIVA PULSE ID 200 (Laser)
TUBING CONNECTING 10 (TUBING) ×2 IMPLANT
TUBING UROLOGY SET (TUBING) ×2 IMPLANT

## 2020-08-11 NOTE — Interval H&P Note (Signed)
History and Physical Interval Note:  08/11/2020 3:31 PM  Alisha Lam  has presented today for surgery, with the diagnosis of right proximal stone.  The various methods of treatment have been discussed with the patient and family. After consideration of risks, benefits and other options for treatment, the patient has consented to  Procedure(s): CYSTOSCOPY WITH RETROGRADE PYELOGRAM, URETEROSCOPY AND STENT PLACEMENT (Right) as a surgical intervention.  The patient's history has been reviewed, patient examined, no change in status, stable for surgery.  I have reviewed the patient's chart and labs.  Questions were answered to the patient's satisfaction.     Bertram Millard Georgios Kina

## 2020-08-11 NOTE — H&P (Signed)
H&P  Chief Complaint: Kidney stone  History of Present Illness: Alisha Lam is a 49 y.o. year old female who presents at this time for stent placement for urgent management of a large right upper ureteral stone.  She presented earlier today with sudden onset flank pain.  She does have a prior history of urolithiasis and passed a recent smaller ureteral calculus.  Past Medical History:  Diagnosis Date  . Allergies   . Endometriosis   . Kidney stones     Past Surgical History:  Procedure Laterality Date  . OOPHORECTOMY      Home Medications:  Medications Prior to Admission  Medication Sig Dispense Refill  . ondansetron (ZOFRAN-ODT) 4 MG disintegrating tablet Take 1 tablet (4 mg total) by mouth every 8 (eight) hours as needed for nausea or vomiting. 8 tablet 0  . oxyCODONE-acetaminophen (PERCOCET/ROXICET) 5-325 MG tablet Take 1-2 tablets by mouth every 8 (eight) hours as needed for severe pain. 10 tablet 0  . tamsulosin (FLOMAX) 0.4 MG CAPS capsule Take 1 capsule (0.4 mg total) by mouth daily. 5 capsule 0  . cetirizine (ZYRTEC) 10 MG tablet Take 10 mg by mouth daily.    . naproxen (NAPROSYN) 250 MG tablet Take 1 tablet (250 mg total) by mouth 2 (two) times daily with a meal. (Patient not taking: Reported on 08/01/2020) 14 tablet 0    Allergies:  Allergies  Allergen Reactions  . Zithromax [Azithromycin] Diarrhea    Family History  Problem Relation Age of Onset  . Kidney Stones Father     Social History:  reports that she has never smoked. She has never used smokeless tobacco. She reports that she does not drink alcohol and does not use drugs.  ROS: A complete review of systems was performed.  All systems are negative except for pertinent findings as noted.  Physical Exam:  Vital signs in last 24 hours: Temp:  [98.3 F (36.8 C)] 98.3 F (36.8 C) (12/29 1353) Pulse Rate:  [83] 83 (12/29 1353) Resp:  [16] 16 (12/29 1353) BP: (117)/(57) 117/57 (12/29 1353) SpO2:  [99 %] 99  % (12/29 1353) Weight:  [52.6 kg] 52.6 kg (12/29 1353) General:  Alert and oriented, No acute distress HEENT: Normocephalic, atraumatic Neck: No JVD or lymphadenopathy Cardiovascular: Regular rate  Lungs: Normal inspiratory/expiratory excursion Abdomen: Soft, nontender, nondistended, no abdominal masses Back: No CVA tenderness Extremities: No edema Neurologic: Grossly intact  Laboratory Data:  Results for orders placed or performed during the hospital encounter of 08/11/20 (from the past 24 hour(s))  Resp Panel by RT-PCR (Flu A&B, Covid) Nasopharyngeal Swab     Status: None   Collection Time: 08/11/20  1:45 PM   Specimen: Nasopharyngeal Swab; Nasopharyngeal(NP) swabs in vial transport medium  Result Value Ref Range   SARS Coronavirus 2 by RT PCR NEGATIVE NEGATIVE   Influenza A by PCR NEGATIVE NEGATIVE   Influenza B by PCR NEGATIVE NEGATIVE   Recent Results (from the past 240 hour(s))  Resp Panel by RT-PCR (Flu A&B, Covid) Nasopharyngeal Swab     Status: None   Collection Time: 08/11/20  1:45 PM   Specimen: Nasopharyngeal Swab; Nasopharyngeal(NP) swabs in vial transport medium  Result Value Ref Range Status   SARS Coronavirus 2 by RT PCR NEGATIVE NEGATIVE Final    Comment: (NOTE) SARS-CoV-2 target nucleic acids are NOT DETECTED.  The SARS-CoV-2 RNA is generally detectable in upper respiratory specimens during the acute phase of infection. The lowest concentration of SARS-CoV-2 viral copies this assay can  detect is 138 copies/mL. A negative result does not preclude SARS-Cov-2 infection and should not be used as the sole basis for treatment or other patient management decisions. A negative result may occur with  improper specimen collection/handling, submission of specimen other than nasopharyngeal swab, presence of viral mutation(s) within the areas targeted by this assay, and inadequate number of viral copies(<138 copies/mL). A negative result must be combined with clinical  observations, patient history, and epidemiological information. The expected result is Negative.  Fact Sheet for Patients:  BloggerCourse.com  Fact Sheet for Healthcare Providers:  SeriousBroker.it  This test is no t yet approved or cleared by the [place] FDA and  has been authorized for detection and/or diagnosis of SARS-CoV-2 by FDA under an Emergency Use Authorization (EUA). This EUA will remain  in effect (meaning this test can be used) for the duration of the COVID-19 declaration under Section 564(b)(1) of the Act, 21 U.S.C.section 360bbb-3(b)(1), unless the authorization is terminated  or revoked sooner.       Influenza A by PCR NEGATIVE NEGATIVE Final   Influenza B by PCR NEGATIVE NEGATIVE Final    Comment: (NOTE) The Xpert Xpress SARS-CoV-2/FLU/RSV plus assay is intended as an aid in the diagnosis of influenza from Nasopharyngeal swab specimens and should not be used as a sole basis for treatment. Nasal washings and aspirates are unacceptable for Xpert Xpress SARS-CoV-2/FLU/RSV testing.  Fact Sheet for Patients: BloggerCourse.com  Fact Sheet for Healthcare Providers: SeriousBroker.it  This test is not yet approved or cleared by the [place] FDA and has been authorized for detection and/or diagnosis of SARS-CoV-2 by FDA under an Emergency Use Authorization (EUA). This EUA will remain in effect (meaning this test can be used) for the duration of the COVID-19 declaration under Section 564(b)(1) of the Act, 21 U.S.C. section 360bbb-3(b)(1), unless the authorization is terminated or revoked.  Performed at [facility], [address], [place], [place] [address]    Creatinine: No results for input(s): CREATININE in the last 168 hours.  Radiologic Imaging: No results found.  Impression/Assessment:  7 x 12 mm right upper ureteral  stone  Plan:  1.  Cystoscopy, right retrograde, right double-J stent placement in advance of:  2.  Shockwave lithotripsy next week  Alisha Lam 08/11/2020, 3:05 PM  Alisha Lam. Alisha Hiemstra MD

## 2020-08-11 NOTE — Anesthesia Preprocedure Evaluation (Addendum)
Anesthesia Evaluation  Patient identified by MRN, date of birth, ID band Patient awake    Reviewed: Allergy & Precautions, NPO status , Patient's Chart, lab work & pertinent test results  History of Anesthesia Complications Negative for: history of anesthetic complications  Airway Mallampati: II  TM Distance: >3 FB Neck ROM: Full    Dental no notable dental hx.    Pulmonary neg pulmonary ROS,    Pulmonary exam normal        Cardiovascular negative cardio ROS Normal cardiovascular exam     Neuro/Psych negative neurological ROS  negative psych ROS   GI/Hepatic negative GI ROS, Neg liver ROS,   Endo/Other  negative endocrine ROS  Renal/GU Renal stone, Cr 1.1  negative genitourinary   Musculoskeletal negative musculoskeletal ROS (+)   Abdominal   Peds  Hematology negative hematology ROS (+)   Anesthesia Other Findings Day of surgery medications reviewed with patient.  Reproductive/Obstetrics negative OB ROS                            Anesthesia Physical Anesthesia Plan  ASA: II  Anesthesia Plan: General   Post-op Pain Management:    Induction: Intravenous  PONV Risk Score and Plan: 3 and Treatment may vary due to age or medical condition, Ondansetron, Dexamethasone and Midazolam  Airway Management Planned: LMA  Additional Equipment: None  Intra-op Plan:   Post-operative Plan: Extubation in OR  Informed Consent: I have reviewed the patients History and Physical, chart, labs and discussed the procedure including the risks, benefits and alternatives for the proposed anesthesia with the patient or authorized representative who has indicated his/her understanding and acceptance.     Dental advisory given  Plan Discussed with: CRNA  Anesthesia Plan Comments:        Anesthesia Quick Evaluation

## 2020-08-11 NOTE — Anesthesia Procedure Notes (Signed)
Procedure Name: LMA Insertion Date/Time: 08/11/2020 4:02 PM Performed by: Florene Route, CRNA Patient Re-evaluated:Patient Re-evaluated prior to induction Oxygen Delivery Method: Circle system utilized Preoxygenation: Pre-oxygenation with 100% oxygen Induction Type: IV induction Ventilation: Mask ventilation without difficulty LMA: LMA inserted LMA Size: 4.0 Number of attempts: 1 Placement Confirmation: positive ETCO2 and breath sounds checked- equal and bilateral Tube secured with: Tape Dental Injury: Teeth and Oropharynx as per pre-operative assessment

## 2020-08-11 NOTE — Discharge Instructions (Signed)
1. You may see some blood in the urine and may have some burning with urination for 48-72 hours. You also may notice that you have to urinate more frequently or urgently after your procedure which is normal.  2. You should call should you develop an inability urinate, fever > 101, persistent nausea and vomiting that prevents you from eating or drinking to stay hydrated.  3. If you have a stent, you will likely urinate more frequently and urgently until the stent is removed and you may experience some discomfort/pain in the lower abdomen and flank

## 2020-08-11 NOTE — Op Note (Signed)
Preoperative diagnosis: Right proximal ureteral stone, intractable pain  Postoperative diagnosis: Right proximal ureteral stone  Principal procedure: Cystoscopy, right retrograde ureteropyelogram, fluoroscopic interpretation, placement of 6 French by 24 cm contour double-J stent without tether  Surgeon: Mckenna Boruff  Anesthesia: General with LMA  Complications: None  Estimated blood loss: None  Indications: 49 year old female with history of urolithiasis, presenting with sudden onset of flank pain this morning.  This was accompanied by nausea and vomiting.  It was difficult to get the patient pain-free in the office.  She was found to have a moderate sized right proximal ureteral stone.  It was recommended, because of her significant pain, that she undergo urgent stent placement followed by lithotripsy in the near future.  We discussed the procedure with her, risks, complications and she desires to proceed.  Findings: Bladder appeared normal.  Ureteral orifice ease normal in configuration location.  Retrograde study of the right ureter revealed a normal ureter throughout.  By the time the retrograde was performed, it was evident that the stone which was present at the UPJ was now in the right interpolar region.  There was no significant pyelocaliectasis.  Description of procedure: The patient was properly identified and marked in the holding area.  She was taken to the operating room where general anesthetic was administered with the LMA.  She was placed in the dorsolithotomy position.  Genitalia and perineum were prepped, draped, proper timeout was performed.  21 French panendoscope was advanced into the bladder.  Circumferential inspection was performed.  Right retrograde ureteropyelogram was performed using 6 [place] open-ended catheter as well as Omnipaque with the above-mentioned findings.  Following this, sensor tip guidewire was advanced through the open-ended catheter up the ureter and guided  into the upper pole calyceal system.  The open-ended catheter was then removed.  Over top of the guidewire I then passed a 24 cm x 6 French contour double-J stent with a tether removed.  Once it was positioned adequately, the stent was deployed with the guidewire being removed.  Excellent proximal distal curls were seen using fluoroscopic and cystoscopic guidance, respectively.  At this point, the bladder was drained, the scope removed and the procedure terminated.  The patient was then awakened and taken to the PACU in stable condition having tolerated procedure well

## 2020-08-11 NOTE — Transfer of Care (Signed)
Immediate Anesthesia Transfer of Care Note  Patient: Alisha Lam  Procedure(s) Performed: CYSTOSCOPY WITH RETROGRADE PYELOGRAM, URETEROSCOPY AND RIGHT STENT PLACEMENT (Right )  Patient Location: PACU  Anesthesia Type:General  Level of Consciousness: awake, alert  and oriented  Airway & Oxygen Therapy: Patient Spontanous Breathing and Patient connected to face mask  Post-op Assessment: Report given to RN and Post -op Vital signs reviewed and stable  Post vital signs: Reviewed and stable  Last Vitals:  Vitals Value Taken Time  BP 118/60 08/11/20 1632  Temp 37 C 08/11/20 1632  Pulse 101 08/11/20 1634  Resp 16 08/11/20 1634  SpO2 100 % 08/11/20 1634  Vitals shown include unvalidated device data.  Last Pain:  Vitals:   08/11/20 1403  PainSc: 1          Complications: No complications documented.

## 2020-08-12 ENCOUNTER — Encounter (HOSPITAL_COMMUNITY): Payer: Self-pay | Admitting: Urology

## 2020-08-12 NOTE — Progress Notes (Signed)
Patient will arrive at 0830 for rapid covid test on morning of ESWL, 08/16/2020. History and medications reviewed. Pre-procedure instructions given.  NPO after MN Sunday except for clear liquids until 0630. Driver secured.

## 2020-08-12 NOTE — Anesthesia Postprocedure Evaluation (Signed)
Anesthesia Post Note  Patient: Alisha Lam  Procedure(s) Performed: CYSTOSCOPY WITH RETROGRADE PYELOGRAM, URETEROSCOPY AND RIGHT STENT PLACEMENT (Right )     Patient location during evaluation: PACU Anesthesia Type: General Level of consciousness: awake and alert and oriented Pain management: pain level controlled Vital Signs Assessment: post-procedure vital signs reviewed and stable Respiratory status: spontaneous breathing, nonlabored ventilation and respiratory function stable Cardiovascular status: blood pressure returned to baseline Postop Assessment: no apparent nausea or vomiting Anesthetic complications: no   No complications documented.     Kaylyn Layer

## 2020-08-16 ENCOUNTER — Encounter (HOSPITAL_BASED_OUTPATIENT_CLINIC_OR_DEPARTMENT_OTHER)
Admission: RE | Disposition: A | Discharge status: Home / Self Care | Payer: Self-pay | Source: Home / Self Care | Attending: Urology

## 2020-08-16 ENCOUNTER — Ambulatory Visit (HOSPITAL_COMMUNITY): Payer: BC Managed Care – PPO

## 2020-08-16 ENCOUNTER — Ambulatory Visit (HOSPITAL_BASED_OUTPATIENT_CLINIC_OR_DEPARTMENT_OTHER)
Admission: RE | Admit: 2020-08-16 | Discharge: 2020-08-16 | Disposition: A | Discharge status: Home / Self Care | Payer: BC Managed Care – PPO | Attending: Urology | Admitting: Urology

## 2020-08-16 ENCOUNTER — Other Ambulatory Visit: Payer: Self-pay

## 2020-08-16 ENCOUNTER — Encounter (HOSPITAL_BASED_OUTPATIENT_CLINIC_OR_DEPARTMENT_OTHER): Payer: Self-pay | Admitting: Urology

## 2020-08-16 DIAGNOSIS — Z20822 Contact with and (suspected) exposure to covid-19: Secondary | ICD-10-CM | POA: Diagnosis not present

## 2020-08-16 DIAGNOSIS — N201 Calculus of ureter: Secondary | ICD-10-CM

## 2020-08-16 DIAGNOSIS — Z79899 Other long term (current) drug therapy: Secondary | ICD-10-CM | POA: Insufficient documentation

## 2020-08-16 DIAGNOSIS — N200 Calculus of kidney: Secondary | ICD-10-CM | POA: Insufficient documentation

## 2020-08-16 DIAGNOSIS — Z881 Allergy status to other antibiotic agents status: Secondary | ICD-10-CM | POA: Diagnosis not present

## 2020-08-16 HISTORY — PX: EXTRACORPOREAL SHOCK WAVE LITHOTRIPSY: SHX1557

## 2020-08-16 LAB — RESP PANEL BY RT-PCR (FLU A&B, COVID) ARPGX2
Influenza A by PCR: NEGATIVE
Influenza B by PCR: NEGATIVE
SARS Coronavirus 2 by RT PCR: NEGATIVE

## 2020-08-16 SURGERY — LITHOTRIPSY, ESWL
Anesthesia: LOCAL | Laterality: Right

## 2020-08-16 MED ORDER — DIPHENHYDRAMINE HCL 25 MG PO CAPS
25.0000 mg | ORAL_CAPSULE | ORAL | Status: AC
  Administered 2020-08-16: 25 mg via ORAL

## 2020-08-16 MED ORDER — DIPHENHYDRAMINE HCL 25 MG PO CAPS
ORAL_CAPSULE | ORAL | Status: AC
  Filled 2020-08-16: qty 1

## 2020-08-16 MED ORDER — SODIUM CHLORIDE 0.9 % IV SOLN: INTRAVENOUS | Status: DC

## 2020-08-16 MED ORDER — CIPROFLOXACIN HCL 500 MG PO TABS
ORAL_TABLET | ORAL | Status: AC
  Filled 2020-08-16: qty 1

## 2020-08-16 MED ORDER — DIAZEPAM 5 MG PO TABS
10.0000 mg | ORAL_TABLET | ORAL | Status: AC
  Administered 2020-08-16: 10 mg via ORAL

## 2020-08-16 MED ORDER — DIAZEPAM 5 MG PO TABS
ORAL_TABLET | ORAL | Status: AC
  Filled 2020-08-16: qty 2

## 2020-08-16 MED ORDER — CIPROFLOXACIN HCL 500 MG PO TABS
500.0000 mg | ORAL_TABLET | ORAL | Status: AC
  Administered 2020-08-16: 500 mg via ORAL

## 2020-08-16 NOTE — Interval H&P Note (Signed)
History and Physical Interval Note:  08/16/2020 9:32 AM  Alisha Lam  has presented today for surgery, with the diagnosis of RIGHT PROXIMAL URETERAL STONE.  The various methods of treatment have been discussed with the patient and family. After consideration of risks, benefits and other options for treatment, the patient has consented to  Procedure(s): EXTRACORPOREAL SHOCK WAVE LITHOTRIPSY (ESWL) (Right) as a surgical intervention.  The patient's history has been reviewed, patient examined, no change in status, stable for surgery.  I have reviewed the patient's chart and labs.  Questions were answered to the patient's satisfaction.     Belva Agee

## 2020-08-16 NOTE — Brief Op Note (Signed)
08/16/2020  12:50 PM  PATIENT:  Alisha Lam  50 y.o. female  PRE-OPERATIVE DIAGNOSIS:  RIGHT RENAL STONE  POST-OPERATIVE DIAGNOSIS:  * SAME *  PROCEDURE:  Procedure(s): EXTRACORPOREAL SHOCK WAVE LITHOTRIPSY (ESWL) (Right)  SURGEON:  Surgeon(s) and Role:    * Belva Agee, MD - Primary  PHYSICIAN ASSISTANT:   ASSISTANTS: none   ANESTHESIA:   IV sedation  EBL:  minimal   BLOOD ADMINISTERED:none  DRAINS: none   LOCAL MEDICATIONS USED:  NONE  SPECIMEN:  No Specimen  DISPOSITION OF SPECIMEN:  N/A  COUNTS:  YES  TOURNIQUET:  * No tourniquets in log *  DICTATION: .Note written in EPIC  PLAN OF CARE: Discharge to home after PACU  PATIENT DISPOSITION:  PACU - hemodynamically stable.   Delay start of Pharmacological VTE agent (>24hrs) due to surgical blood loss or risk of bleeding: not applicable

## 2020-08-16 NOTE — Discharge Instructions (Signed)
Lithotripsy, Care After This sheet gives you information about how to care for yourself after your procedure. Your health care provider may also give you more specific instructions. If you have problems or questions, contact your health care provider. What can I expect after the procedure? After the procedure, it is common to have:  Some blood in your urine. This should only last for a few days.  Soreness in your back, sides, or upper abdomen for a few days.  Blotches or bruises on your back where the pressure wave entered the skin.  Pain, discomfort, or nausea when pieces (fragments) of the kidney stone move through the tube that carries urine from the kidney to the bladder (ureter). Stone fragments may pass soon after the procedure, but they may continue to pass for up to 4-8 weeks. ? If you have severe pain or nausea, contact your health care provider. This may be caused by a large stone that was not broken up, and this may mean that you need more treatment.  Some pain or discomfort during urination.  Some pain or discomfort in the lower abdomen or (in men) at the base of the penis. Follow these instructions at home: Medicines  Take over-the-counter and prescription medicines only as told by your health care provider.  If you were prescribed an antibiotic medicine, take it as told by your health care provider. Do not stop taking the antibiotic even if you start to feel better.  Do not drive for 24 hours if you were given a medicine to help you relax (sedative).  Do not drive or use heavy machinery while taking prescription pain medicine. Eating and drinking      Drink enough water and fluids to keep your urine clear or pale yellow. This helps any remaining pieces of the stone to pass. It can also help prevent new stones from forming.  Eat plenty of fresh fruits and vegetables.  Follow instructions from your health care provider about eating and drinking restrictions. You may be  instructed: ? To reduce how much salt (sodium) you eat or drink. Check ingredients and nutrition facts on packaged foods and beverages. ? To reduce how much meat you eat.  Eat the recommended amount of calcium for your age and gender. Ask your health care provider how much calcium you should have. General instructions  Get plenty of rest.  Most people can resume normal activities 1-2 days after the procedure. Ask your health care provider what activities are safe for you.  Your health care provider may direct you to lie in a certain position (postural drainage) and tap firmly (percuss) over your kidney area to help stone fragments pass. Follow instructions as told by your health care provider.  If directed, strain all urine through the strainer that was provided by your health care provider. ? Keep all fragments for your health care provider to see. Any stones that are found may be sent to a medical lab for examination. The stone may be as small as a grain of salt.  Keep all follow-up visits as told by your health care provider. This is important. Contact a health care provider if:  You have pain that is severe or does not get better with medicine.  You have nausea that is severe or does not go away.  You have blood in your urine longer than your health care provider told you to expect.  You have more blood in your urine.  You have pain during urination that does   not go away.  You urinate more frequently than usual and this does not go away.  You develop a rash or any other possible signs of an allergic reaction. Get help right away if:  You have severe pain in your back, sides, or upper abdomen.  You have severe pain while urinating.  Your urine is very dark red.  You have blood in your stool (feces).  You cannot pass any urine at all.  You feel a strong urge to urinate after emptying your bladder.  You have a fever or chills.  You develop shortness of breath,  difficulty breathing, or chest pain.  You have severe nausea that leads to persistent vomiting.  You faint. Summary  After this procedure, it is common to have some pain, discomfort, or nausea when pieces (fragments) of the kidney stone move through the tube that carries urine from the kidney to the bladder (ureter). If this pain or nausea is severe, however, you should contact your health care provider.  Most people can resume normal activities 1-2 days after the procedure. Ask your health care provider what activities are safe for you.  Drink enough water and fluids to keep your urine clear or pale yellow. This helps any remaining pieces of the stone to pass, and it can help prevent new stones from forming.  If directed, strain your urine and keep all fragments for your health care provider to see. Fragments or stones may be as small as a grain of salt.  Get help right away if you have severe pain in your back, sides, or upper abdomen or have severe pain while urinating. This information is not intended to replace advice given to you by your health care provider. Make sure you discuss any questions you have with your health care provider. Document Revised: 11/11/2018 Document Reviewed: 06/21/2016 Elsevier Patient Education  2020 [employer]  

## 2020-08-16 NOTE — H&P (Signed)
Acute Kidney Stone  HPI: Alisha Lam is a 50 year-old female established patient who is here for further eval and management of kidney stones.  08/10/2020: 49 year old female who presents for 1 week follow-up after trialing met for a left sided 4 mm obstructing stone. She has been tolerating tamsulosin and reports that she passed her stone. Her KUB does not show the stone within the distal left ureter. There is still a stone burden noted within the right upper pole. She denies fevers, chills, flank pain, gross hematuria. She brings her stone with her today for analysis.   A 08/11/2020: 50 year old female who developed a sudden onset of right-sided flank pain associated with nausea and vomiting that began this a.m.. The pain is described as severe. She has not been able to keep any oral medications down. She was seen in the clinic yesterday after passing a left sided distal stone.   She was diagnosed with a kidney stone on approximately 07/27/2020. The patient presented to [facility] with symptoms of a kidney stone.   Her pain started about approximately 07/27/2020. The pain is on the right side.   The patient underwent KUB prior to today's appointment.   The patient relates initially having nausea, vomiting, and flank pain. She is currently having flank pain, back pain, nausea, and vomiting. She denies having groin pain, fever, chills, and voiding symptoms. She has been taking oxycodone, tamsulosin. She has not caught a stone in her urine strainer since her symptoms began.   She has never had surgical treatment for calculi in the past. This is not her first kidney stone. Her first stone was approximately 07/14/2013.     ALLERGIES: Zithromax    MEDICATIONS: Tamsulosin Hcl 0.4 mg capsule 1 capsule PO Daily  Ultram 50 mg tablet 1-2 tablet PO Q 6 H  Zyrtec 10 mg capsule  Oxycodone Hcl  Tamsulosin Hcl 0.4 mg capsule 0 Oral Daily  Vitamin D     GU PSH: Ovary Removal Partial or Total -  2014       PSH Notes: Oophorectomy   NON-GU PSH: None   GU PMH: Renal and ureteral calculus - 08/10/2020 Ureteral calculus - 08/03/2020, Ureteral stone, - 2014 Personal Hx Oth female genital tract diseases, History of endometriosis - 2014 Renal calculus, Calculus of kidney - 2014    NON-GU PMH: Encounter for general adult medical examination without abnormal findings, Encounter for preventive health examination - 2014    FAMILY HISTORY: Kidney Stones - Runs In Family   SOCIAL HISTORY: Marital Status: Married Preferred Language: English; Ethnicity: Not Hispanic Or Latino; Race: White Current Smoking Status: Patient has never smoked.   Tobacco Use Assessment Completed: Used Tobacco in last 30 days? Has never drank.  Drinks 2 caffeinated drinks per day. Patient's occupation Radio broadcast assistant.     Notes: Occupation, Never smoker, Caffeine use, Alcohol use   REVIEW OF SYSTEMS:    GU Review Female:   Patient reports frequent urination and hard to postpone urination. Patient denies burning /pain with urination, get up at night to urinate, leakage of urine, stream starts and stops, trouble starting your stream, have to strain to urinate, and being pregnant.  Gastrointestinal (Upper):   Patient reports nausea and vomiting. Patient denies indigestion/ heartburn.  Gastrointestinal (Lower):   Patient denies diarrhea and constipation.  Constitutional:   Patient denies fever, night sweats, weight loss, and fatigue.  Skin:   Patient denies skin rash/ lesion and itching.  Eyes:   Patient denies blurred vision  and double vision.  Ears/ Nose/ Throat:   Patient denies sore throat and sinus problems.  Hematologic/Lymphatic:   Patient denies swollen glands and easy bruising.  Cardiovascular:   Patient denies leg swelling and chest pains.  Respiratory:   Patient denies cough and shortness of breath.  Endocrine:   Patient denies excessive thirst.  Musculoskeletal:   Patient denies back pain and  joint pain.  Neurological:   Patient denies headaches and dizziness.  Psychologic:   Patient denies depression and anxiety.   Notes: Severe right flank pain    VITAL SIGNS: None  Notes: No vitals, Patient was doubled over in pain and throwing up...   GU PHYSICAL EXAMINATION:      Notes: Actively vomiting, Right flank tenderness. Pacing room, diaphoretic.    MULTI-SYSTEM PHYSICAL EXAMINATION:    Constitutional: In distress pacing room, vomiting  Respiratory: No labored breathing, no use of accessory muscles.   Cardiovascular: Normal temperature, normal extremity pulses, no swelling, no varicosities.  Skin: Skin pale. diaphoretic  Neurologic / Psychiatric: Oriented to time, oriented to place, oriented to person. No depression, no anxiety, no agitation.     Complexity of Data:  Source Of History:  Patient, Family/Caregiver, Medical Record Summary  Records Review:   Previous Doctor Records, Previous Patient Records  Urine Test Review:   Urinalysis, Urine Culture  X-Ray Review: C.T. Abdomen/Pelvis: Reviewed Films. Reviewed Report. Discussed With Patient.     08/11/20  Urinalysis  Urine Appearance Cloudy   Urine Color Yellow   Urine Glucose Neg mg/dL  Urine Bilirubin Neg mg/dL  Urine Ketones 1+ mg/dL  Urine Specific Gravity 1.020   Urine Blood 3+ ery/uL  Urine pH 6.0   Urine Protein Neg mg/dL  Urine Urobilinogen 0.2 mg/dL  Urine Nitrites Neg   Urine Leukocyte Esterase Neg leu/uL  Urine WBC/hpf 0 - 5/hpf   Urine RBC/hpf 20 - 40/hpf   Urine Epithelial Cells 0 - 5/hpf   Urine Bacteria Few (10-25/hpf)   Urine Mucous Not Present   Urine Yeast NS (Not Seen)   Urine Trichomonas Not Present   Urine Cystals NS (Not Seen)   Urine Casts NS (Not Seen)   Urine Sperm Not Present    PROCEDURES:         C.T. Urogram - [id number]      Patient confirmed No Neulasta OnPro Device.         Urinalysis w/Scope Dipstick Dipstick Cont'd Micro  Color: Yellow Bilirubin: Neg mg/dL WBC/hpf: 0 -  5/hpf  Appearance: Cloudy Ketones: 1+ mg/dL RBC/hpf: 20 - 40/hpf  Specific Gravity: 1.020 Blood: 3+ ery/uL Bacteria: Few (10-25/hpf)  pH: 6.0 Protein: Neg mg/dL Cystals: NS (Not Seen)  Glucose: Neg mg/dL Urobilinogen: 0.2 mg/dL Casts: NS (Not Seen)    Nitrites: Neg Trichomonas: Not Present    Leukocyte Esterase: Neg leu/uL Mucous: Not Present      Epithelial Cells: 0 - 5/hpf      Yeast: NS (Not Seen)      Sperm: Not Present         Ketoralac 42m - [id number] area was prepped and cleaned using sterile technique. A band aid was applied. The pt tolerated well.   Qty: 60 Adm. By: PPricilla Riffle Unit: mg Lot No [id number] Route: IM Exp. Date 08/14/2021  Freq: None Mfgr.:   Site: Left Hip         Morphine 419m- [id number], J2270 Qty: 4 Adm. By: PaPricilla RiffleUnit: mg Lot No [id number]  Route: IM Exp. Date 05/14/2021  Freq: None Mfgr.:   Site: Right Buttock         Morphine 27m - J2270, 96372 4 mg administered by PPricilla Riffle An additional 2 mg injected by ACristobal Goldmann 2 mg wasted and not used    Qty: 6 Adm. By: ACristobal Goldmann Unit: mg Lot No [id number]  Route: IM Exp. Date 05/14/2021  Freq: None Mfgr.:   Site: Right Buttock         Phenergan 274m- [id number] Qty: 25 Adm. By: AsCristobal GoldmannUnit: mg Lot No [id number]: IM Exp. Date 12/12/2021  Freq: None Mfgr.:   Site: Left Buttock   ASSESSMENT:      ICD-10 Details  1 GU:   Ureteral calculus - N20.1 Right, Acute, Systemic Symptoms   PLAN:           Orders Labs CULTURE, URINE  X-Rays: C.T. Stone Protocol Without Contrast  X-Ray Notes: right  History:  Hematuria: Yes/No  Patient to see MD after exam: Yes/No  Previous exam: CT / IVP/ US/ KUB/ None  When:  Where:  Diabetic: Yes/ No  BUN/ Creatine:  Date of last BUN Creatinine:  Weight in pounds:  Allergy- Contrasts/ Shellfish: Yes/ No  Conflicting diabetic meds: Yes/ No  Oral contrast and instructions given to patient:   Prior Authorization  #: [id number] 08/11/20 thru 02/06/21            Schedule Return Visit/Planned Activity: ASAP - Schedule Surgery  [facility]: [facility] [id number]Toradol Per 15 Mg) - [id number]: [facility] [id number] Per 50 Mg) - [id number]: [facility] - [phone] 4mg 27mer/Proph/Diag Inj, [place]/Im) - [id number]: 2 mg of morphine please          Document Letter(s):  Created for Patient: Clinical Summary         Notes:   She was given intramuscular Phenergan, morphine prior to CT scan. I am awaiting the results to proceed with Toradol. She continues to actively vomit in office and is tearful. We discussed the CT scan showing a a 9 by 6 mm stone within her right proximal ureter and emergent stent placement today followed by ESWL on Monday. The risks of ESWL as well as emergent stent placement were discussed with her and her husband in detail today. She would like to proceed with this. This was also discussed with on-call physician as well as her urologist who both agreed.

## 2020-08-16 NOTE — Op Note (Signed)
08/16/2020  12:50 PM  PATIENT:  Alisha Lam  49 y.o. female  PRE-OPERATIVE DIAGNOSIS:  RIGHT RENAL STONE  POST-OPERATIVE DIAGNOSIS:  * SAME *  PROCEDURE:  Procedure(s): EXTRACORPOREAL SHOCK WAVE LITHOTRIPSY (ESWL) (Right)  SURGEON:  Surgeon(s) and Role:    * Abagayle Klutts B, MD - Primary  PHYSICIAN ASSISTANT:   ASSISTANTS: none   ANESTHESIA:   IV sedation  EBL:  minimal   BLOOD ADMINISTERED:none  DRAINS: none   LOCAL MEDICATIONS USED:  NONE  SPECIMEN:  No Specimen  DISPOSITION OF SPECIMEN:  N/A  COUNTS:  YES  TOURNIQUET:  * No tourniquets in log *  DICTATION: .Note written in EPIC  PLAN OF CARE: Discharge to home after PACU  PATIENT DISPOSITION:  PACU - hemodynamically stable.   Delay start of Pharmacological VTE agent (>24hrs) due to surgical blood loss or risk of bleeding: not applicable  

## 2020-08-17 ENCOUNTER — Encounter (HOSPITAL_BASED_OUTPATIENT_CLINIC_OR_DEPARTMENT_OTHER): Payer: Self-pay | Admitting: Urology

## 2020-10-25 ENCOUNTER — Other Ambulatory Visit: Payer: Self-pay | Admitting: Family Medicine

## 2020-10-25 DIAGNOSIS — R221 Localized swelling, mass and lump, neck: Secondary | ICD-10-CM

## 2020-10-27 ENCOUNTER — Ambulatory Visit (HOSPITAL_COMMUNITY)
Admission: RE | Admit: 2020-10-27 | Discharge: 2020-10-27 | Disposition: A | Discharge status: Home / Self Care | Payer: BC Managed Care – PPO | Source: Ambulatory Visit | Attending: Family Medicine | Admitting: Family Medicine

## 2020-10-27 ENCOUNTER — Other Ambulatory Visit: Payer: Self-pay

## 2020-10-27 DIAGNOSIS — R221 Localized swelling, mass and lump, neck: Secondary | ICD-10-CM | POA: Diagnosis present

## 2020-11-29 ENCOUNTER — Other Ambulatory Visit: Payer: Self-pay | Admitting: Otolaryngology

## 2020-11-29 DIAGNOSIS — E041 Nontoxic single thyroid nodule: Secondary | ICD-10-CM

## 2021-01-11 ENCOUNTER — Ambulatory Visit
Admission: RE | Admit: 2021-01-11 | Discharge: 2021-01-11 | Disposition: A | Discharge status: Home / Self Care | Payer: BC Managed Care – PPO | Source: Ambulatory Visit | Attending: Otolaryngology | Admitting: Otolaryngology

## 2021-01-11 DIAGNOSIS — E041 Nontoxic single thyroid nodule: Secondary | ICD-10-CM

## 2021-01-11 MED ORDER — IOPAMIDOL (ISOVUE-300) INJECTION 61%
75.0000 mL | Freq: Once | INTRAVENOUS | Status: AC | PRN
  Administered 2021-01-11: 75 mL via INTRAVENOUS

## 2021-02-01 ENCOUNTER — Other Ambulatory Visit: Payer: Self-pay | Admitting: Otolaryngology

## 2021-02-01 DIAGNOSIS — E041 Nontoxic single thyroid nodule: Secondary | ICD-10-CM

## 2022-01-30 ENCOUNTER — Ambulatory Visit
Admission: RE | Admit: 2022-01-30 | Discharge: 2022-01-30 | Disposition: A | Discharge status: Home / Self Care | Payer: BC Managed Care – PPO | Source: Ambulatory Visit | Attending: Otolaryngology | Admitting: Otolaryngology

## 2022-01-30 DIAGNOSIS — E041 Nontoxic single thyroid nodule: Secondary | ICD-10-CM

## 2022-03-27 ENCOUNTER — Other Ambulatory Visit: Payer: Self-pay | Admitting: Family Medicine

## 2022-03-27 ENCOUNTER — Ambulatory Visit
Admission: RE | Admit: 2022-03-27 | Discharge: 2022-03-27 | Disposition: A | Discharge status: Home / Self Care | Payer: BC Managed Care – PPO | Source: Ambulatory Visit | Attending: Family Medicine | Admitting: Family Medicine

## 2022-03-27 DIAGNOSIS — R0602 Shortness of breath: Secondary | ICD-10-CM

## 2022-11-23 IMAGING — CT CT NECK W/ CM
5 of 6 series · 15 of 33 positions shown, 17 images · IV contrast (iopamidol)
Comparison: Thyroid ultrasound 10/27/2020.

CLINICAL DATA: Thyroid nodule. Additional history provided: Mass
mid neck near thyroid for 2 months.

EXAM:
CT NECK WITH CONTRAST
TECHNIQUE: Multidetector CT imaging of the neck was performed using the
standard protocol following the bolus administration of intravenous
contrast.
CONTRAST:  75mL XVSVLO-8VV IOPAMIDOL (XVSVLO-8VV) INJECTION 61%

[Series 2: neck 2.00 br40 s3 st/ no angle · axial · 0.45mm/px · z∈[-804,-680]mm · 3 of 124 slices shown, 4 images]
[im 31/124  soft-tissue]
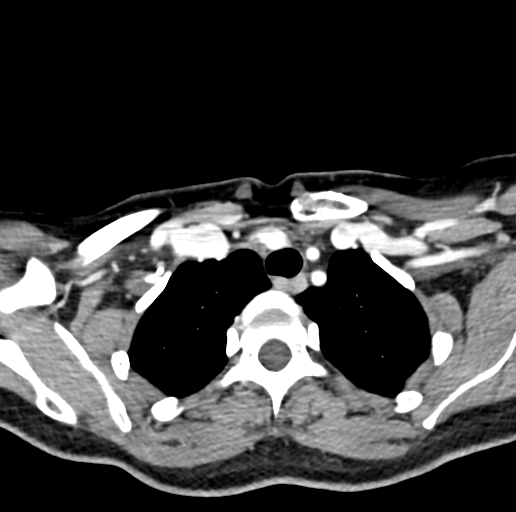
[im 31/124  bone]
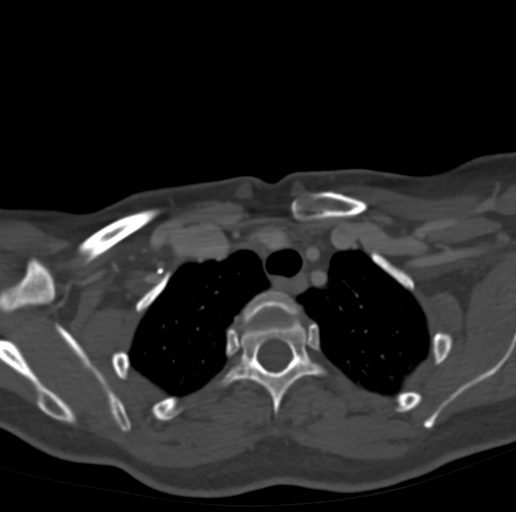
[im 62/124  bone]
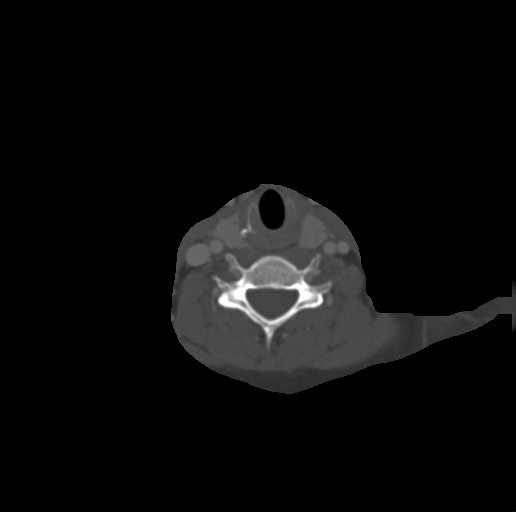
[im 93/124  bone]
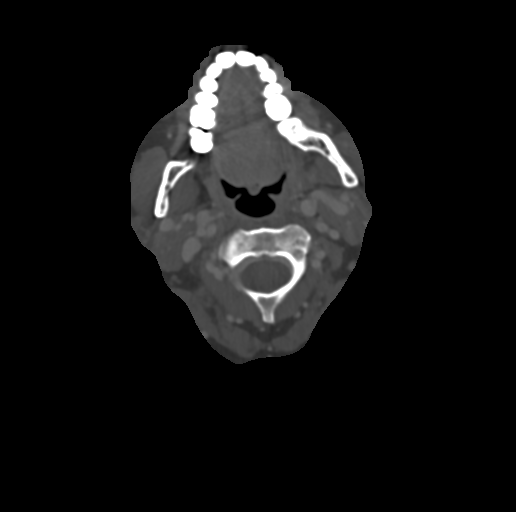

[Series 4: neck 2.00 br60 s3 bone/ no angle · axial · 0.45mm/px · z∈[-782,-700]mm · 2 of 124 slices shown]
[im 42/124  bone]
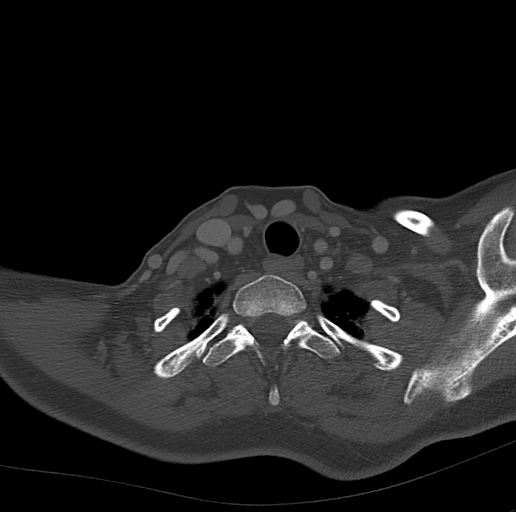
[im 83/124  bone]
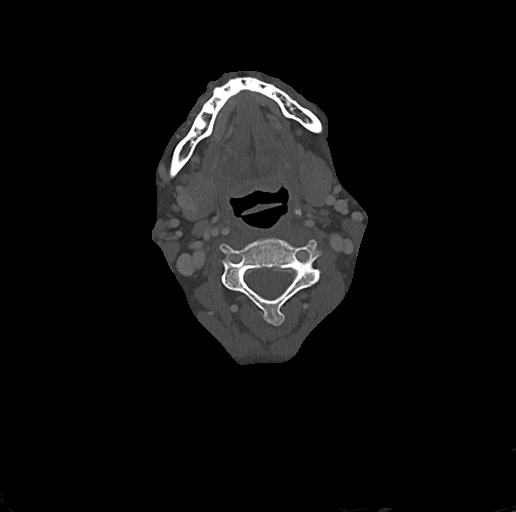

[Series 6: neck 2.00 br36 s3 angled axial (person_name) · axial · 0.43mm/px · z∈[-803,-723]mm · 2 of 124 slices shown]
[im 42/124  bone]
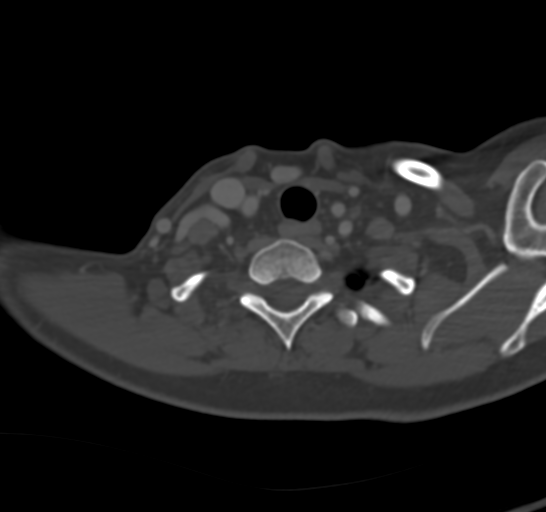
[im 83/124  bone]
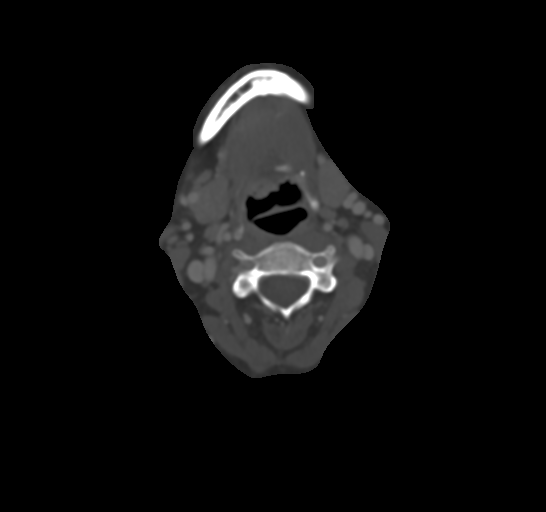

[Series 10: neck 2.00 br40 s3 (person_name) · coronal · 0.46mm/px · 3 of 120 slices shown (1 of 2)]
[im 24/120  bone]
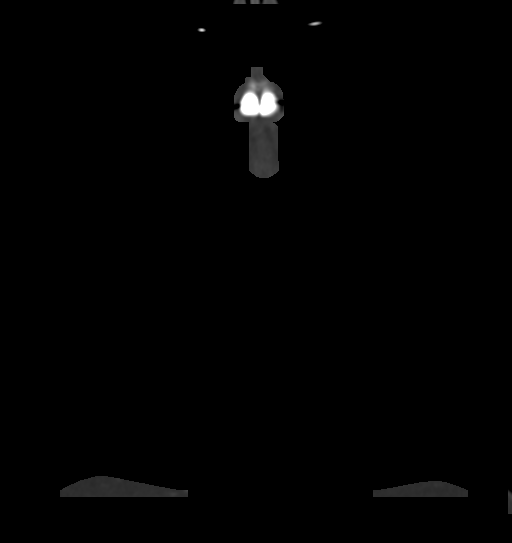
[im 48/120  bone]
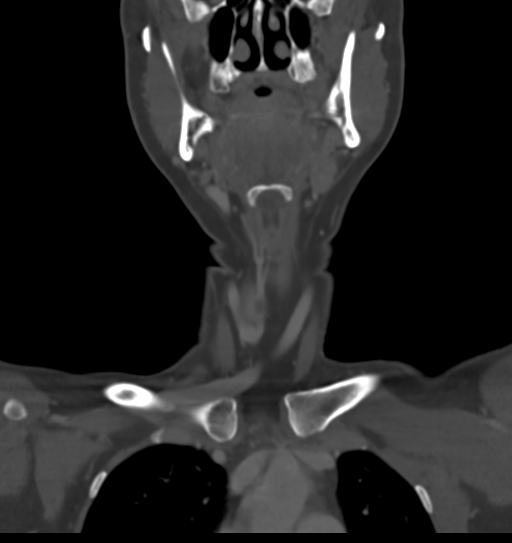
[im 72/120  bone]
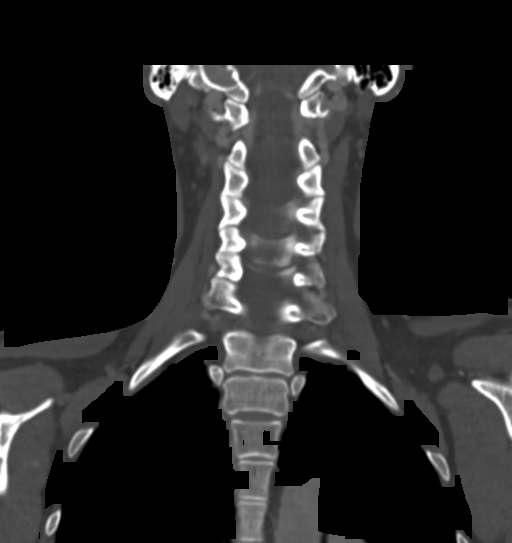

[Series 12: neck 2.00 br40 s3 (person_name) · sagittal · 0.45mm/px · 5 of 103 slices shown, 6 images (2 of 2)]
[im 35/103  bone]
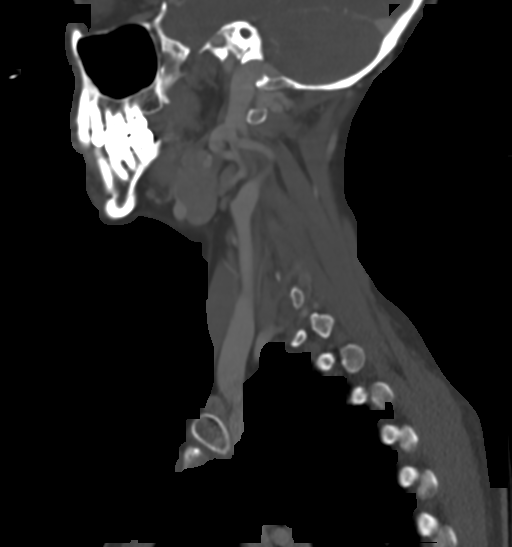
[im 43/103  bone]
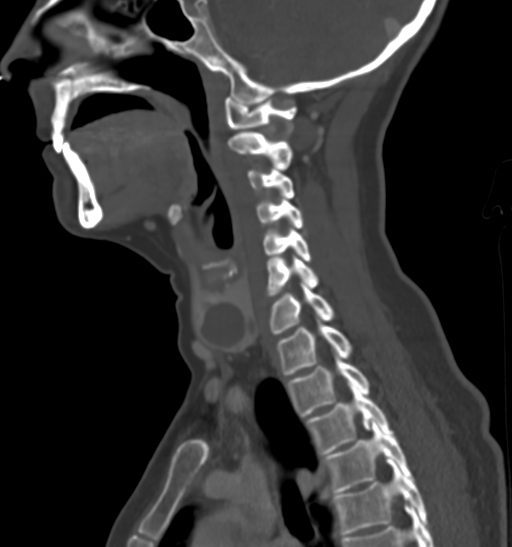
[im 52/103  soft-tissue]
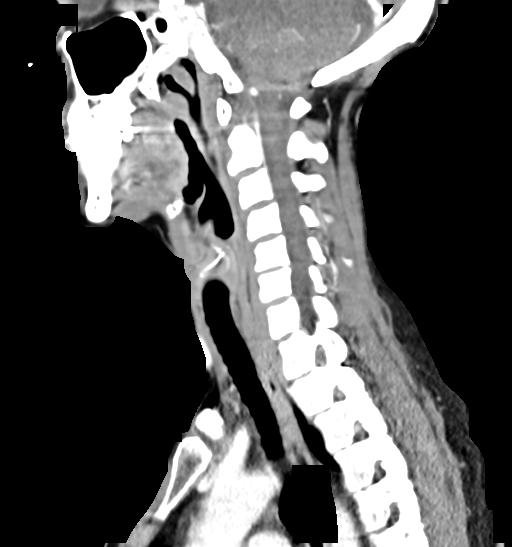
[im 52/103  bone]
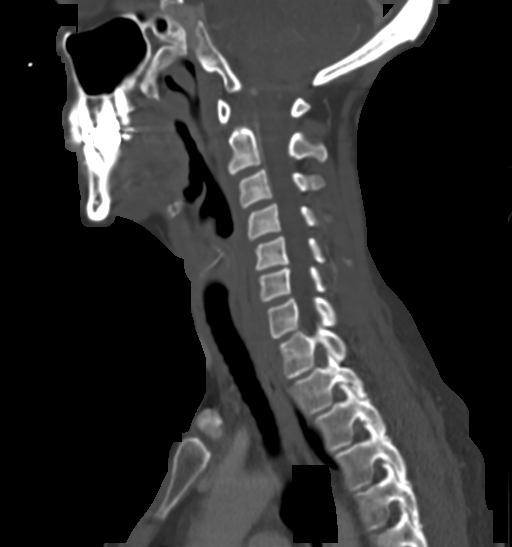
[im 60/103  bone]
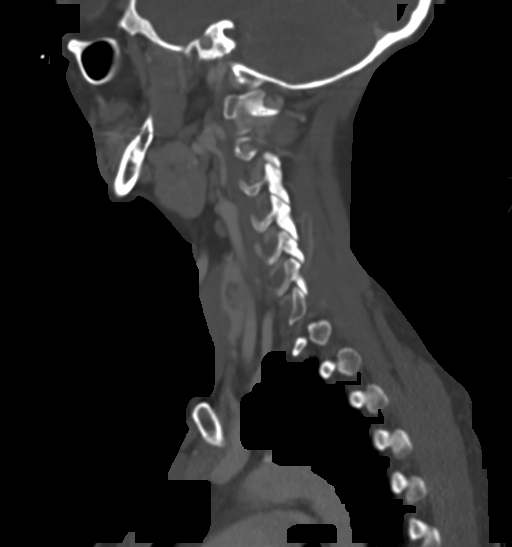
[im 69/103  bone]
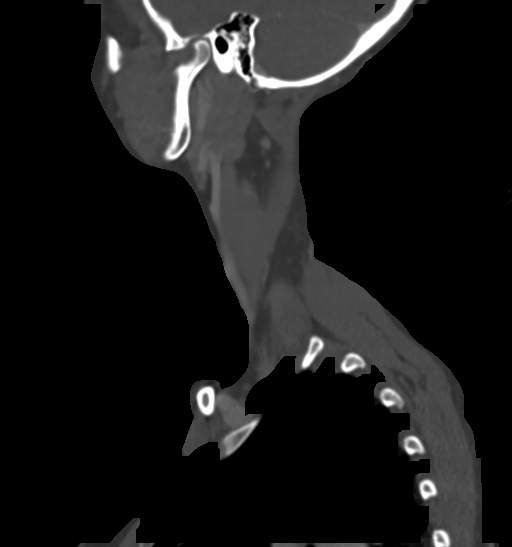

[15 of 33 positions shown; findings below may reference images not displayed]

FINDINGS: Pharynx and larynx: Streak artifact from dental restoration
partially obscures the oral cavity. No appreciable swelling or
discrete mass within the oral cavity, pharynx or larynx.

Salivary glands: No inflammation, mass, or stone.

Thyroid: Multiple bilateral thyroid cysts/nodules, more fully
characterized on the recent prior thyroid ultrasound of 10/27/2020.
The largest nodule within the mid right thyroid lobe measures 2.2 cm
(this likely corresponds with the nodule measured at 3.1 cm on the
prior thyroid ultrasound). This nodule underlies the ventral neck
skin surface marker, and likely accounts for the palpable finding.

Lymph nodes: No pathologically enlarged cervical chain lymph nodes
are identified.

Vascular: The major vascular structures of the neck are patent.

Limited intracranial: No acute intracranial abnormality identified
within the field of view.

Visualized orbits: Incompletely imaged. No mass or acute finding at
the imaged levels.

Mastoids and visualized paranasal sinuses: No significant paranasal
sinus disease or mastoid effusion at the imaged levels.

Skeleton: Reversal of the expected cervical lordosis. Trace C2-C3
grade 1 anterolisthesis. Cervical spondylosis, greatest at C5-C6. No
acute bony abnormality or aggressive osseous lesion.

Upper chest: No consolidation within the imaged lung apices.
IMPRESSION: Multiple bilateral thyroid nodules/cysts, more fully characterized
on the recent prior thyroid ultrasound of 10/27/2020. Please refer
to this prior report for further description/recommendations. The
dominant 2.2 cm nodule within the mid right thyroid lobe underlies
the ventral neck skin surface marker, and likely accounts for the
palpable finding.

No cervical lymphadenopathy.

Cervical spondylosis, greatest at C5-C6.

## 2022-12-26 ENCOUNTER — Other Ambulatory Visit (HOSPITAL_COMMUNITY): Payer: Self-pay | Admitting: Family Medicine

## 2022-12-26 DIAGNOSIS — E785 Hyperlipidemia, unspecified: Secondary | ICD-10-CM

## 2023-01-19 ENCOUNTER — Ambulatory Visit (HOSPITAL_COMMUNITY)
Admission: RE | Admit: 2023-01-19 | Discharge: 2023-01-19 | Disposition: A | Discharge status: Home / Self Care | Payer: BC Managed Care – PPO | Source: Ambulatory Visit | Attending: Family Medicine | Admitting: Family Medicine

## 2023-01-19 DIAGNOSIS — E785 Hyperlipidemia, unspecified: Secondary | ICD-10-CM | POA: Insufficient documentation

## 2023-12-12 IMAGING — US US THYROID
1 series · 13 of 25 positions shown · non-contrast
Comparison: 10/27/2020

CLINICAL DATA: 50-year-old female with a history of thyroid nodules

EXAM:
THYROID ULTRASOUND
TECHNIQUE: Ultrasound examination of the thyroid gland and adjacent soft
tissues was performed.

[Series 1: us thyroid · 0.07mm/px · 13 of 68 slices shown]
[im 1/68]
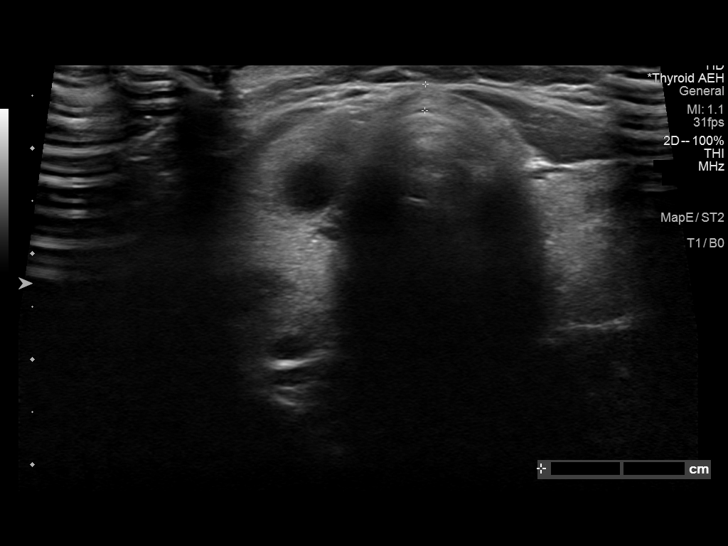
[im 6/68]
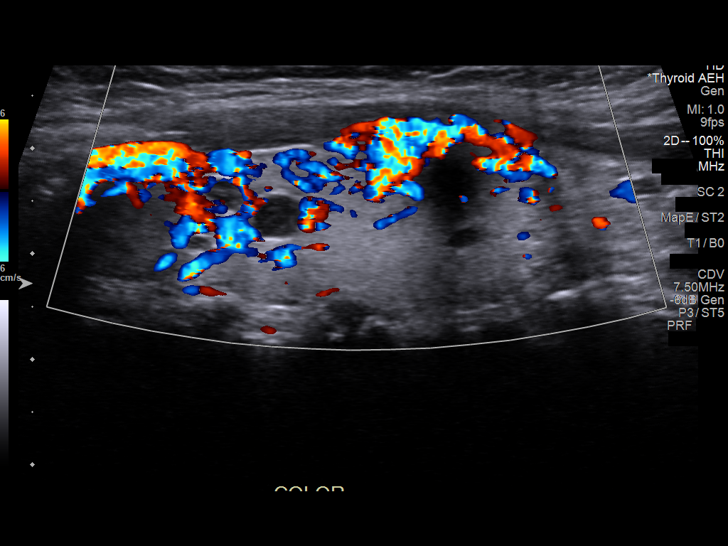
[im 12/68]
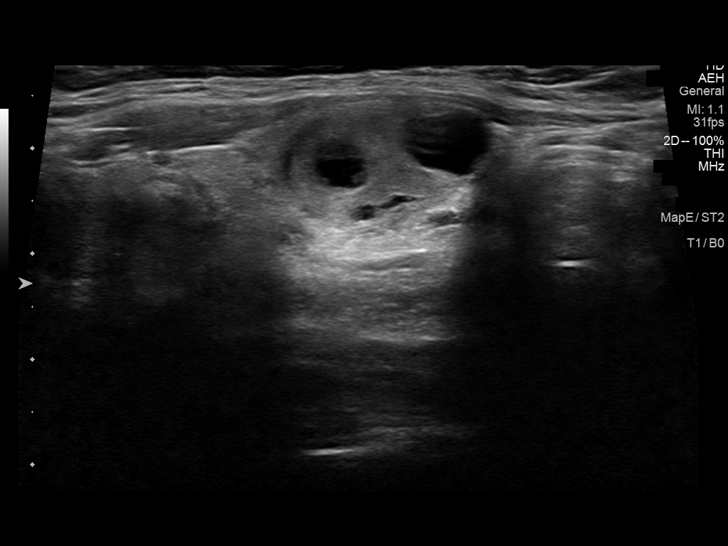
[im 17/68]
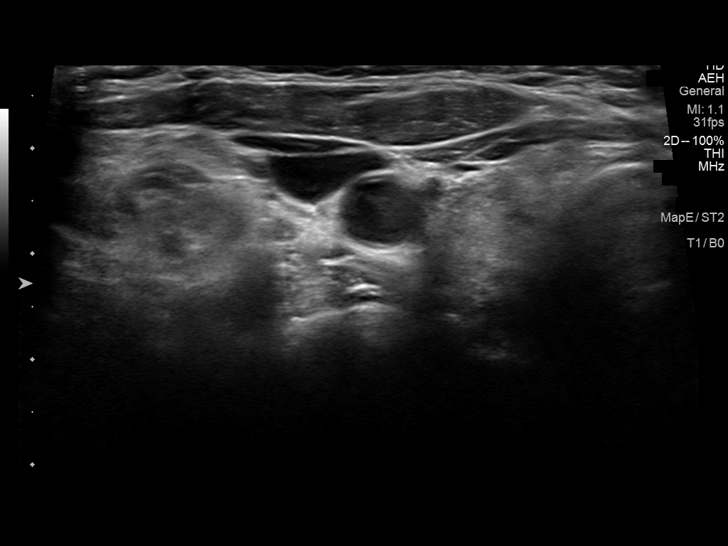
[im 23/68]
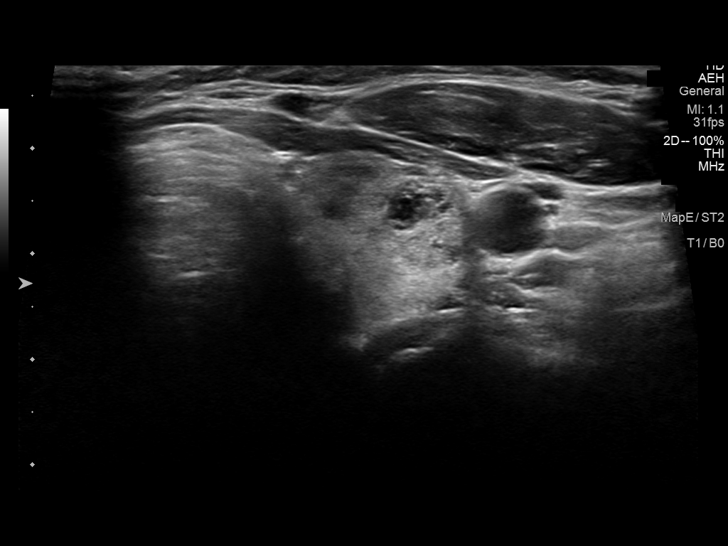
[im 28/68]
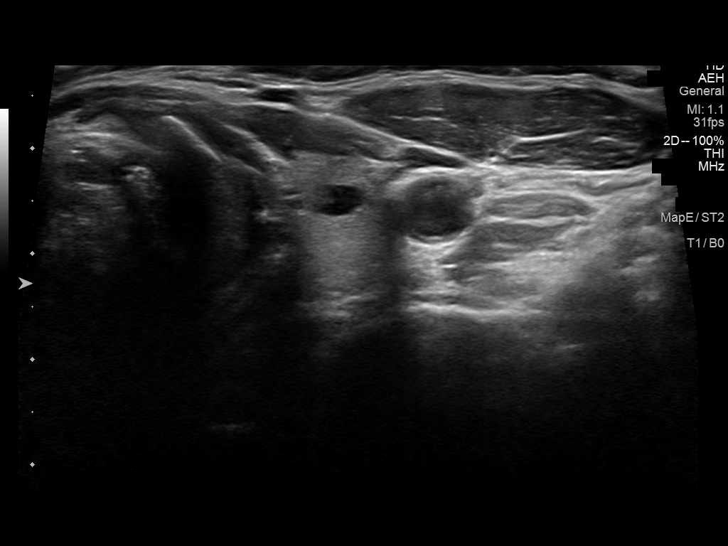
[im 34/68]
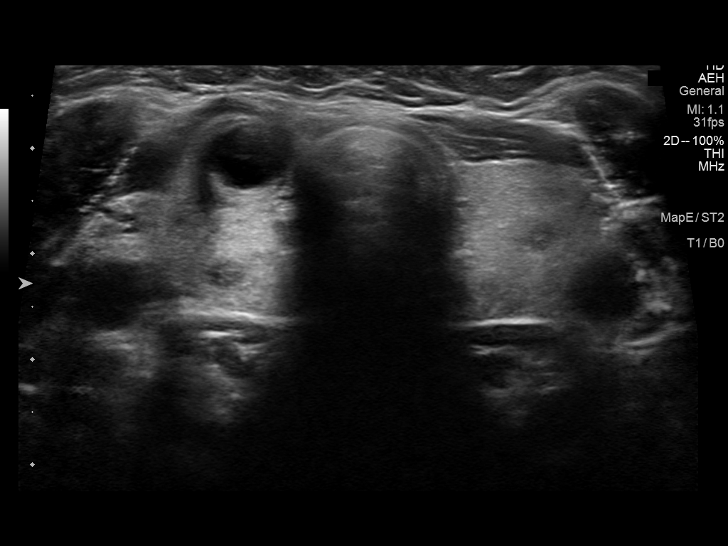
[im 40/68]
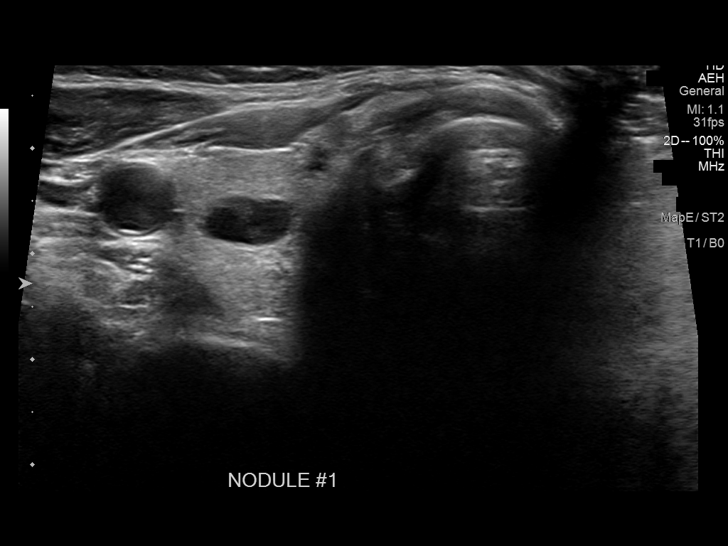
[im 45/68]
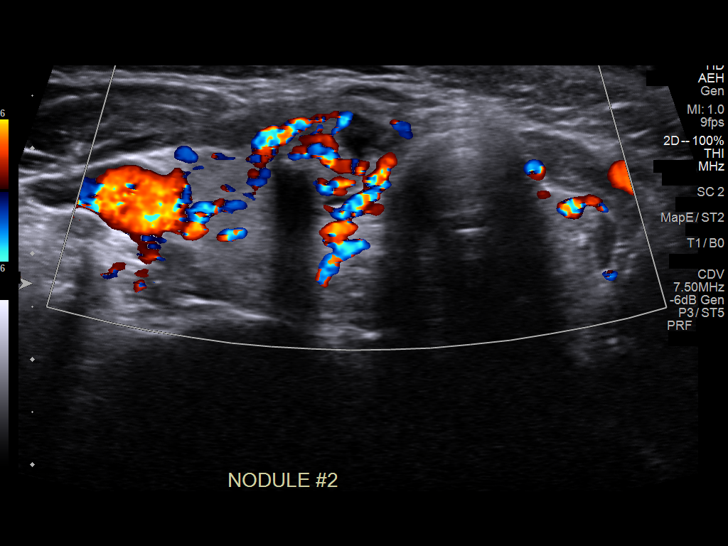
[im 51/68]
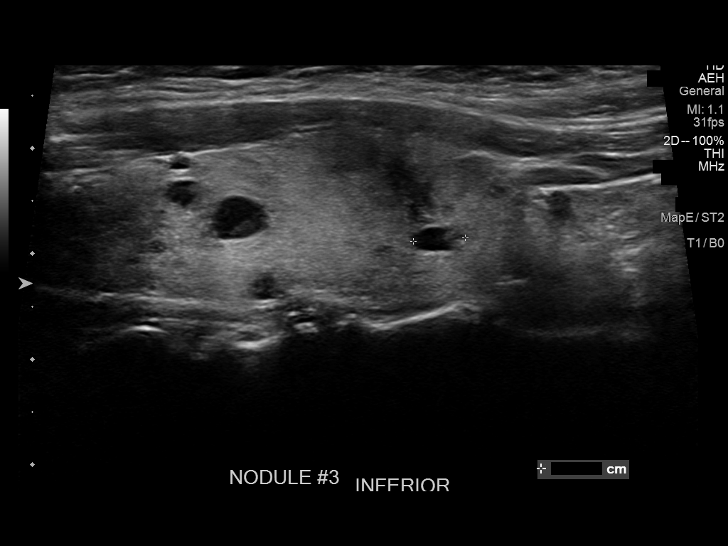
[im 56/68]
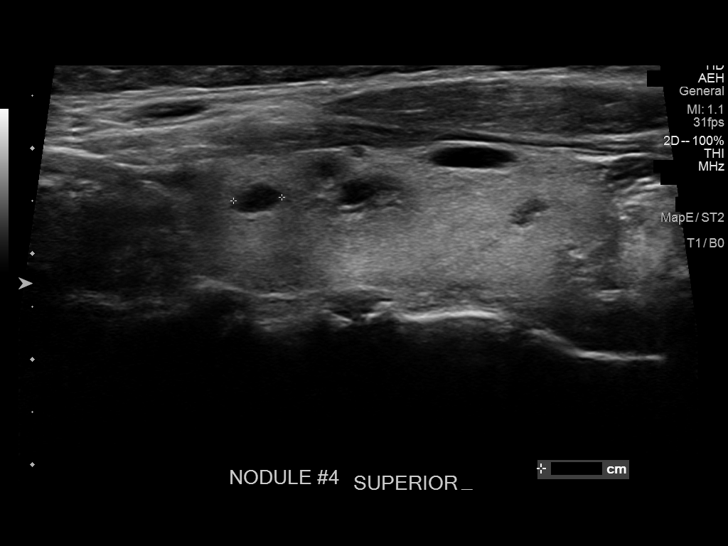
[im 62/68]
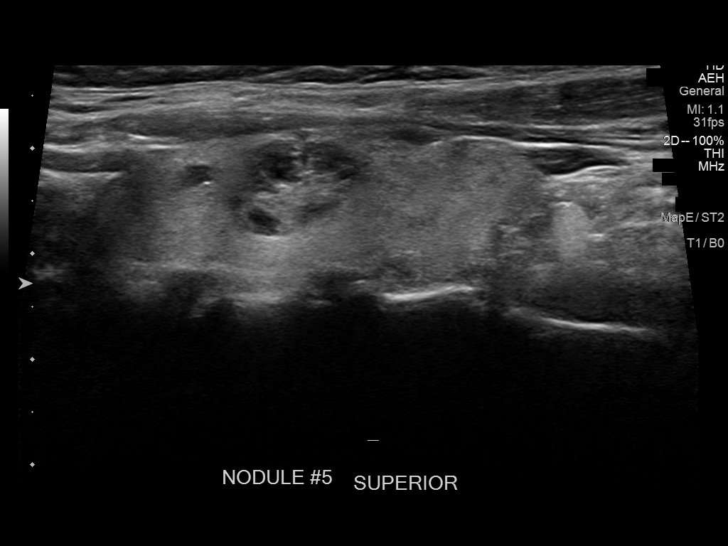
[im 68/68]
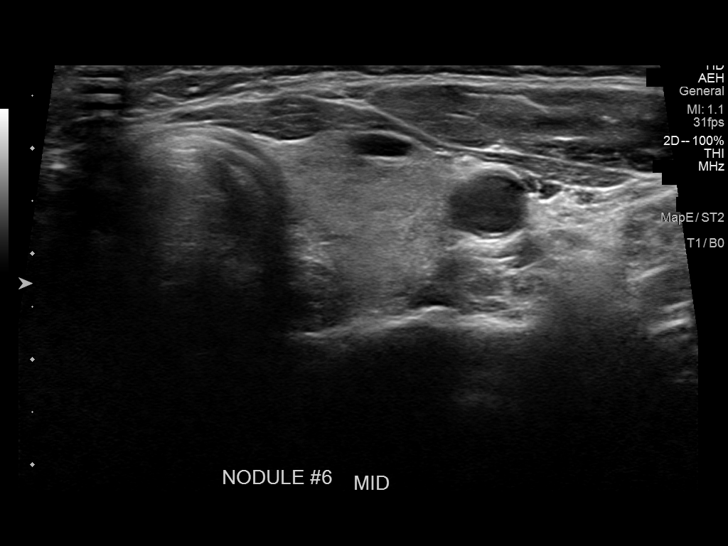

[13 of 25 positions shown; findings below may reference images not displayed]

FINDINGS: Parenchymal Echotexture: Mildly heterogenous

Isthmus: 0.3 cm

Right lobe: 5.4 cm x 1.6 cm x 2.2 cm

Left lobe: 4.6 cm x 1.5 cm x 1.7 cm

_________________________________________________________

Estimated total number of nodules >/= 1 cm: 2

Number of spongiform nodules >/=  2 cm not described below (TR1): 0

Number of mixed cystic and solid nodules >/= 1.5 cm not described
below (TR2): 0

_________________________________________________________

Nodule labeled 1 superior right thyroid, 8 mm. Nodule is unchanged
in size, TR 2, and does not meet criteria for surveillance.

Nodule labeled 2, mid right thyroid, 2.2 cm, previously 3.1 cm.
Nodule is cystic, TR 2 characteristics and does not meet criteria
for surveillance.

Nodules labeled 3 and 4, right thyroid, with TR 2/cystic
characteristics. Neither meets criteria for surveillance.

Nodule labeled 5, left superior thyroid, 1.3 cm, decreased from
cm. Nodule has spongiform characteristics and does not meet criteria
for surveillance.

Nodule labeled 6, left thyroid, 8 mm. Nodule has TR 2
characteristics and does not meet criteria for surveillance.

No adenopathy
IMPRESSION: Similar appearance of multi cystic/multinodular thyroid.

None of the cysts/nodules meets criteria for biopsy or surveillance,
as designated by the newly established ACR TI-RADS criteria.

Recommendations follow those established by the new ACR TI-RADS
criteria ([HOSPITAL] 0366;[DATE]).
